# Patient Record
Sex: Female | Born: 1945 | Race: White | Hispanic: No | Marital: Married | Smoking: Never smoker
Health system: Southern US, Community
[De-identification: ages and names within clinical notes are randomized; demographics above are authoritative.]

## PROBLEM LIST (undated history)

## (undated) DIAGNOSIS — K209 Esophagitis, unspecified without bleeding: Secondary | ICD-10-CM

## (undated) DIAGNOSIS — K5730 Diverticulosis of large intestine without perforation or abscess without bleeding: Secondary | ICD-10-CM

## (undated) DIAGNOSIS — R51 Headache: Secondary | ICD-10-CM

## (undated) DIAGNOSIS — N951 Menopausal and female climacteric states: Secondary | ICD-10-CM

## (undated) DIAGNOSIS — E7800 Pure hypercholesterolemia, unspecified: Secondary | ICD-10-CM

## (undated) DIAGNOSIS — T7840XA Allergy, unspecified, initial encounter: Secondary | ICD-10-CM

## (undated) DIAGNOSIS — R519 Headache, unspecified: Secondary | ICD-10-CM

## (undated) DIAGNOSIS — I38 Endocarditis, valve unspecified: Secondary | ICD-10-CM

## (undated) DIAGNOSIS — K2970 Gastritis, unspecified, without bleeding: Secondary | ICD-10-CM

## (undated) DIAGNOSIS — I350 Nonrheumatic aortic (valve) stenosis: Secondary | ICD-10-CM

## (undated) DIAGNOSIS — K2990 Gastroduodenitis, unspecified, without bleeding: Secondary | ICD-10-CM

## (undated) DIAGNOSIS — K219 Gastro-esophageal reflux disease without esophagitis: Secondary | ICD-10-CM

## (undated) DIAGNOSIS — M8580 Other specified disorders of bone density and structure, unspecified site: Secondary | ICD-10-CM

## (undated) DIAGNOSIS — J309 Allergic rhinitis, unspecified: Secondary | ICD-10-CM

## (undated) DIAGNOSIS — K644 Residual hemorrhoidal skin tags: Secondary | ICD-10-CM

## (undated) DIAGNOSIS — I10 Essential (primary) hypertension: Secondary | ICD-10-CM

## (undated) HISTORY — DX: Essential (primary) hypertension: I10

## (undated) HISTORY — PX: TUBAL LIGATION: SHX77

## (undated) HISTORY — DX: Esophagitis, unspecified without bleeding: K20.90

## (undated) HISTORY — DX: Other specified disorders of bone density and structure, unspecified site: M85.80

## (undated) HISTORY — DX: Residual hemorrhoidal skin tags: K64.4

## (undated) HISTORY — DX: Pure hypercholesterolemia, unspecified: E78.00

## (undated) HISTORY — DX: Menopausal and female climacteric states: N95.1

## (undated) HISTORY — DX: Allergy, unspecified, initial encounter: T78.40XA

## (undated) HISTORY — DX: Gastro-esophageal reflux disease without esophagitis: K21.9

## (undated) HISTORY — DX: Esophagitis, unspecified: K20.9

## (undated) HISTORY — DX: Gastroduodenitis, unspecified, without bleeding: K29.90

## (undated) HISTORY — DX: Gastritis, unspecified, without bleeding: K29.70

## (undated) HISTORY — DX: Allergic rhinitis, unspecified: J30.9

## (undated) HISTORY — DX: Diverticulosis of large intestine without perforation or abscess without bleeding: K57.30

---

## 2000-08-04 LAB — HM PAP SMEAR: HM Pap smear: NORMAL

## 2000-09-17 LAB — HM DEXA SCAN

## 2004-07-07 HISTORY — PX: CARDIAC CATHETERIZATION: SHX172

## 2005-06-06 HISTORY — PX: CHOLECYSTECTOMY: SHX55

## 2005-06-12 ENCOUNTER — Other Ambulatory Visit: Payer: Self-pay

## 2005-06-12 ENCOUNTER — Inpatient Hospital Stay: Payer: Self-pay | Admitting: Internal Medicine

## 2005-06-13 ENCOUNTER — Other Ambulatory Visit: Payer: Self-pay

## 2005-06-14 ENCOUNTER — Other Ambulatory Visit: Payer: Self-pay

## 2005-06-15 ENCOUNTER — Other Ambulatory Visit: Payer: Self-pay

## 2005-06-23 ENCOUNTER — Ambulatory Visit: Payer: Self-pay | Admitting: Internal Medicine

## 2005-09-10 ENCOUNTER — Ambulatory Visit: Payer: Self-pay | Admitting: Obstetrics and Gynecology

## 2006-12-29 ENCOUNTER — Ambulatory Visit: Payer: Self-pay | Admitting: Obstetrics and Gynecology

## 2007-01-19 ENCOUNTER — Ambulatory Visit: Payer: Self-pay | Admitting: Gastroenterology

## 2007-01-19 LAB — HM COLONOSCOPY

## 2008-01-10 ENCOUNTER — Ambulatory Visit: Payer: Self-pay | Admitting: Obstetrics and Gynecology

## 2008-02-05 ENCOUNTER — Emergency Department: Payer: Self-pay | Admitting: Emergency Medicine

## 2008-09-18 ENCOUNTER — Emergency Department: Payer: Self-pay | Admitting: Emergency Medicine

## 2009-04-13 ENCOUNTER — Ambulatory Visit: Payer: Self-pay | Admitting: Obstetrics and Gynecology

## 2009-06-13 ENCOUNTER — Ambulatory Visit: Payer: Self-pay | Admitting: Family Medicine

## 2009-07-20 ENCOUNTER — Ambulatory Visit: Payer: Self-pay | Admitting: Family Medicine

## 2009-08-15 ENCOUNTER — Ambulatory Visit: Payer: Self-pay | Admitting: Family Medicine

## 2010-04-17 ENCOUNTER — Ambulatory Visit: Payer: Self-pay | Admitting: Obstetrics and Gynecology

## 2010-06-19 ENCOUNTER — Ambulatory Visit: Payer: Self-pay

## 2010-08-15 ENCOUNTER — Ambulatory Visit: Payer: Self-pay | Admitting: Family Medicine

## 2010-08-28 ENCOUNTER — Encounter: Payer: Self-pay | Admitting: Family Medicine

## 2010-09-05 ENCOUNTER — Encounter: Payer: Self-pay | Admitting: Family Medicine

## 2010-09-25 ENCOUNTER — Ambulatory Visit: Payer: Self-pay

## 2010-10-06 ENCOUNTER — Encounter: Payer: Self-pay | Admitting: Family Medicine

## 2011-04-02 ENCOUNTER — Ambulatory Visit: Payer: Self-pay | Admitting: Family Medicine

## 2011-10-14 ENCOUNTER — Ambulatory Visit: Payer: Self-pay | Admitting: Family Medicine

## 2012-01-15 ENCOUNTER — Ambulatory Visit: Payer: Self-pay | Admitting: Family Medicine

## 2012-04-05 ENCOUNTER — Encounter: Payer: Self-pay | Admitting: *Deleted

## 2012-04-08 ENCOUNTER — Encounter: Payer: Self-pay | Admitting: Family Medicine

## 2012-04-12 ENCOUNTER — Ambulatory Visit (INDEPENDENT_AMBULATORY_CARE_PROVIDER_SITE_OTHER): Payer: BC Managed Care – PPO | Admitting: Family Medicine

## 2012-04-12 ENCOUNTER — Ambulatory Visit: Payer: BC Managed Care – PPO

## 2012-04-12 ENCOUNTER — Encounter: Payer: Self-pay | Admitting: Family Medicine

## 2012-04-12 VITALS — BP 107/77 | HR 73 | Temp 98.4°F | Resp 16 | Ht 62.0 in | Wt 145.0 lb

## 2012-04-12 DIAGNOSIS — R079 Chest pain, unspecified: Secondary | ICD-10-CM

## 2012-04-12 DIAGNOSIS — J309 Allergic rhinitis, unspecified: Secondary | ICD-10-CM | POA: Insufficient documentation

## 2012-04-12 DIAGNOSIS — J209 Acute bronchitis, unspecified: Secondary | ICD-10-CM

## 2012-04-12 DIAGNOSIS — I10 Essential (primary) hypertension: Secondary | ICD-10-CM | POA: Insufficient documentation

## 2012-04-12 DIAGNOSIS — K219 Gastro-esophageal reflux disease without esophagitis: Secondary | ICD-10-CM | POA: Insufficient documentation

## 2012-04-12 DIAGNOSIS — E7800 Pure hypercholesterolemia, unspecified: Secondary | ICD-10-CM | POA: Insufficient documentation

## 2012-04-12 DIAGNOSIS — Z0000 Encounter for general adult medical examination without abnormal findings: Secondary | ICD-10-CM

## 2012-04-12 MED ORDER — CLARITHROMYCIN 500 MG PO TABS: 500.0000 mg | ORAL_TABLET | Freq: Two times a day (BID) | ORAL | Status: DC

## 2012-04-12 NOTE — Progress Notes (Signed)
[address]   [place], [place]  [address]   [phone]  Subjective:    Patient ID: Heather Ortiz, female    DOB: [birth year], 66 y.o.   MRN: [id number]  HPIThis 66 y.o. female presents for evaluation of the following:  1. Hyperlipidemia:  Six month follow-up; no changes to management made at last visit.  Reports good tolerance to medication; good symptom control, good compliance with medication.  +chest pain, no palpitations, shortness of breath, leg swelling. No headaches, dizziness, focal weakness, or paresthesias.  2.  Hypertension:  Six month follow-up.  Home blood pressure running 130/80.  Reports good compliance with medication, good tolerance to medication, good symptom control.  +chest pain yesterday.  No palpitations, shortness of breath, diaphoresis.  3. Low back pain:  Three month follow-up.  Improved from last visit.  Took 7-10 days to improve.    4. Cold: onset one week.  Husband with similar symptoms.  No fever/chills/sweats.  +L ear decreased hearing; no ear pain; +fluttering in ear.  No sore throat.  No headache.  No rhinorrhea.  +nasal congestion mild.  +PND.  +coughing.  +sputum production greenish yellow thick.  No SOB.  No wheezing.  Slightly better.  Nyquil and Claritin.  No vomiting but +diarrhea x 4-5 times yesterday.   5.  Neck strain: cleaning carpets and moving furniture.  Pulled muscles in posterior neck.  Still having pain with rotating side to side.  Will take msucle relaxer.    6.  Allergic Rhinitis:  Horrible all summer.  +rhinorrhea; +sneezing.  Compliance with daily antihistamine; not using nasal steroid regularly.  7. Chest pain: awoke yesterday with substernal/epigastric chest pain; moderate in severity; +pressure; no radiation into jaw or shoulder. No associated diaphoresis, shortness of breath, numbness in L arm, nausea.  Took 2 Tums with improvement; duration 20 minutes; onset at rest.  Exerting self did not worsen or initiate pain.  No recurrence.  Has  suffered with acute illness with decreased appetite, loose stools. Has also been a bit non-compliant with daily Nexium due to family stressors. No indigestion or heartburn with chest pain yesterday.  No recurrence.      Review of Systems  Constitutional: Positive for fatigue. Negative for fever, chills, diaphoresis and unexpected weight change.  HENT: Positive for congestion and postnasal drip. Negative for rhinorrhea and sneezing.   Respiratory: Positive for cough. Negative for shortness of breath, wheezing and stridor.   Cardiovascular: Positive for chest pain. Negative for palpitations and leg swelling.  Gastrointestinal: Negative for nausea, vomiting, abdominal pain, constipation, blood in stool and abdominal distention.  Musculoskeletal: Positive for myalgias and back pain.        Past Medical History  Diagnosis Date  . Pure hypercholesterolemia   . GERD (gastroesophageal reflux disease)   . Allergic rhinitis, cause unspecified   . Unspecified essential hypertension   . Symptomatic menopausal or female climacteric states   . Unspecified gastritis and gastroduodenitis without mention of hemorrhage   . Esophagitis, unspecified   . Diverticulosis of colon (without mention of hemorrhage)   . External hemorrhoids without mention of complication   . Osteopenia     Past Surgical History  Procedure Date  . Tubal ligation   . Cardiac catheterization 2006    Normal coronary arteries. [place]  . Cholecystectomy 06/2005    Prior to Admission medications   Medication Sig Start Date End Date Taking? Authorizing Provider  aspirin 81 MG tablet Take 81 mg by mouth daily.  Yes Historical Provider, MD  Calcium Carbonate-Vitamin D (CALTRATE 600+D) 600-400 MG-UNIT per chew tablet Chew 3 tablets by mouth daily.   Yes Historical Provider, MD  Cholecalciferol (VITAMIN D-3) 1000 UNITS CAPS Take by mouth daily.   Yes Historical Provider, MD  esomeprazole (NEXIUM) 40 MG capsule Take 40 mg by  mouth daily before breakfast.   Yes Historical Provider, MD  hydrochlorothiazide (MICROZIDE) 12.5 MG capsule Take 12.5 mg by mouth daily.   Yes Historical Provider, MD  loratadine (CLARITIN) 10 MG tablet Take 10 mg by mouth daily.   Yes Historical Provider, MD  Multiple Vitamins-Minerals (CENTRUM SILVER PO) Take by mouth daily.   Yes Historical Provider, MD  rosuvastatin (CRESTOR) 10 MG tablet Take 10 mg by mouth daily.   Yes Historical Provider, MD  triamcinolone (NASACORT) 55 MCG/ACT nasal inhaler Place 2 sprays into the nose daily.   Yes Historical Provider, MD    Allergies  Allergen Reactions  . Bee Venom     Bee Stings  . Influenza Vaccines     Arm Swelling  . Levaquin (Levofloxacin In D5w)     Joint pain , jittery  . Lipitor (Atorvastatin)     Joint pain  . Mobic (Meloxicam)     Flush face   . Penicillins Hives  . Simvastatin     Joint pain  . Sulfa Drugs Cross Reactors Hives  . Zithromax (Azithromycin) Hives  . Ceftin (Cefuroxime Axetil) Rash  . Doxycycline Rash    History   Social History  . Marital Status: Married    Spouse Name: N/A    Number of Children: 3  . Years of Education: 12   Occupational History  . retired     Education officer, community [employer]   Social History Main Topics  . Smoking status: Never Smoker   . Smokeless tobacco: Not on file  . Alcohol Use: Yes     once weekly  . Drug Use: No  . Sexually Active: Not on file   Other Topics Concern  . Not on file   Social History Narrative   Always uses seat belts. Married x 44 years happily. 3 children and 8 grandchildren.Caffeine Use: Coffee, tea, 3 servings / day. Exercise: Moderate, 4 x week; Recently joined a gym.    Family History  Problem Relation Age of Onset  . Hypertension Mother   . Heart disease Mother     CAD  . Stroke Mother     TIA's  . Cancer Mother     ? kidney/bladder  . Emphysema Father   . Glaucoma Father     also Mother  . Hyperlipidemia Brother   . Meniere's disease Brother     . Aortic aneurysm      Objective:   Physical Exam  Constitutional: She is oriented to person, place, and time. She appears well-developed and well-nourished. No distress.  HENT:  Head: Normocephalic and atraumatic.  Right Ear: Tympanic membrane, external ear and ear canal normal.  Left Ear: External ear normal. Tympanic membrane is retracted.  Nose: Nose normal.  Mouth/Throat: Oropharynx is clear and moist. No oropharyngeal exudate.  Eyes: Conjunctivae normal and EOM are normal. Pupils are equal, round, and reactive to light.  Neck: Normal range of motion. Neck supple. No thyromegaly present.  Cardiovascular: Normal rate, regular rhythm, normal heart sounds and intact distal pulses.  Exam reveals no gallop and no friction rub.   No murmur heard. Pulmonary/Chest: Effort normal and breath sounds normal. No respiratory distress. She has  no wheezes. She has no rales. She exhibits no tenderness.  Abdominal: Soft. Bowel sounds are normal. She exhibits no distension and no mass. There is no tenderness. There is no rebound and no guarding.  Lymphadenopathy:    She has no cervical adenopathy.  Neurological: She is alert and oriented to person, place, and time. No cranial nerve deficit. She exhibits normal muscle tone. Coordination normal.  Skin: She is not diaphoretic.  Psychiatric: She has a normal mood and affect. Her behavior is normal. Judgment and thought content normal.    EKG: NSR; no acute changes.  [facility] reading (PRIMARY) by  Dr. Katrinka Blazing.  CXR: NAD       Assessment & Plan:   1. Pure hypercholesterolemia  CBC with Differential  2. Routine check-up  CK, Comprehensive metabolic panel, Lipid panel, HgB A1c  3. Chest pain  EKG 12-Lead, DG Chest 2 View  4. Acute bronchitis  clarithromycin (BIAXIN) 500 MG tablet  5. Essential hypertension, benign    6. GERD (gastroesophageal reflux disease)    7. Allergic rhinitis       1.  Hyperlipidemia: controlled; no change in management;  continue current medication. 2.  Hypertension: controlled; no change in management; obtain labs. 3.  Chest pain: New.  Atypical.  Normal EKG; normal CXR.  Consistent with GERD induced chest pain; improved with Tums.  If recurrent and/or exertional, to ED. 4.  Acute bronchitis/URI: new.  Start Mucinex DM bid; if no improvement in 4 days, start Biaxin.   5.  GERD: worsening with non-compliance with daily Nexium.  To restart daily Nexium.   6.  Allergic Rhinitis: worsening over the summer; to increase daily nasal steroid to daily use.

## 2012-04-12 NOTE — Patient Instructions (Addendum)
1. Pure hypercholesterolemia  CBC with Differential  2. Routine check-up  CK, Comprehensive metabolic panel, Lipid panel, HgB A1c  3. Chest pain  EKG 12-Lead, DG Chest 2 View  4. Acute bronchitis  clarithromycin (BIAXIN) 500 MG tablet  5. Essential hypertension, benign    6. GERD (gastroesophageal reflux disease)    7. Allergic rhinitis

## 2012-04-13 LAB — COMPREHENSIVE METABOLIC PANEL
CO2: 30 mEq/L (ref 19–32)
Creat: 0.87 mg/dL (ref 0.50–1.10)
Glucose, Bld: 84 mg/dL (ref 70–99)
Total Bilirubin: 0.6 mg/dL (ref 0.3–1.2)

## 2012-04-13 LAB — CBC WITH DIFFERENTIAL/PLATELET
Basophils Absolute: 0.1 10*3/uL (ref 0.0–0.1)
Basophils Relative: 1 % (ref 0–1)
Eosinophils Absolute: 0.2 10*3/uL (ref 0.0–0.7)
Hemoglobin: 13.9 g/dL (ref 12.0–15.0)
MCH: 30.8 pg (ref 26.0–34.0)
MCHC: 32.8 g/dL (ref 30.0–36.0)
Monocytes Absolute: 0.4 10*3/uL (ref 0.1–1.0)
Monocytes Relative: 6 % (ref 3–12)
Neutrophils Relative %: 56 % (ref 43–77)
RDW: 13.2 % (ref 11.5–15.5)

## 2012-04-13 LAB — LIPID PANEL
Cholesterol: 268 mg/dL — ABNORMAL HIGH (ref 0–200)
HDL: 57 mg/dL (ref >39)
Total CHOL/HDL Ratio: 4.7 Ratio
Triglycerides: 179 mg/dL — ABNORMAL HIGH (ref <150)
VLDL: 36 mg/dL (ref 0–40)

## 2012-04-19 ENCOUNTER — Encounter: Payer: Self-pay | Admitting: Radiology

## 2012-04-24 MED ORDER — ROSUVASTATIN CALCIUM 20 MG PO TABS: 20.0000 mg | ORAL_TABLET | Freq: Every day | ORAL | Status: DC

## 2012-04-24 NOTE — Addendum Note (Signed)
Addended by: Johnnette Litter on: 04/24/2012 11:24 AM   Modules accepted: Orders

## 2012-04-27 NOTE — Progress Notes (Signed)
Three month f-up appt made with Dr. Katrinka Blazing for 07/12/12.Heather Ortiz

## 2012-05-31 ENCOUNTER — Ambulatory Visit: Payer: Self-pay | Admitting: Gastroenterology

## 2012-06-06 HISTORY — PX: COLONOSCOPY: SHX174

## 2012-07-12 ENCOUNTER — Encounter: Payer: Self-pay | Admitting: Family Medicine

## 2012-07-12 ENCOUNTER — Ambulatory Visit (INDEPENDENT_AMBULATORY_CARE_PROVIDER_SITE_OTHER): Payer: BC Managed Care – PPO | Admitting: Family Medicine

## 2012-07-12 VITALS — BP 116/80 | HR 69 | Temp 98.3°F | Resp 16 | Ht 61.75 in | Wt 148.0 lb

## 2012-07-12 DIAGNOSIS — R945 Abnormal results of liver function studies: Secondary | ICD-10-CM

## 2012-07-12 DIAGNOSIS — E7800 Pure hypercholesterolemia, unspecified: Secondary | ICD-10-CM

## 2012-07-12 DIAGNOSIS — K219 Gastro-esophageal reflux disease without esophagitis: Secondary | ICD-10-CM

## 2012-07-12 DIAGNOSIS — R946 Abnormal results of thyroid function studies: Secondary | ICD-10-CM | POA: Insufficient documentation

## 2012-07-12 DIAGNOSIS — I10 Essential (primary) hypertension: Secondary | ICD-10-CM

## 2012-07-12 LAB — CK: Total CK: 27 U/L (ref 7–177)

## 2012-07-12 LAB — COMPREHENSIVE METABOLIC PANEL
ALT: 15 U/L (ref 0–35)
AST: 20 U/L (ref 0–37)
Alkaline Phosphatase: 52 U/L (ref 39–117)
Glucose, Bld: 88 mg/dL (ref 70–99)
Potassium: 4 mEq/L (ref 3.5–5.3)
Sodium: 144 mEq/L (ref 135–145)
Total Bilirubin: 0.5 mg/dL (ref 0.3–1.2)
Total Protein: 7 g/dL (ref 6.0–8.3)

## 2012-07-12 LAB — TSH: TSH: 2.26 u[IU]/mL (ref 0.350–4.500)

## 2012-07-12 LAB — CBC WITH DIFFERENTIAL/PLATELET
Basophils Relative: 1 % (ref 0–1)
Eosinophils Absolute: 0.2 10*3/uL (ref 0.0–0.7)
Eosinophils Relative: 4 % (ref 0–5)
Lymphs Abs: 2 10*3/uL (ref 0.7–4.0)
MCH: 30 pg (ref 26.0–34.0)
MCHC: 33.7 g/dL (ref 30.0–36.0)
MCV: 89 fL (ref 78.0–100.0)
Platelets: 227 10*3/uL (ref 150–400)
RBC: 4.47 MIL/uL (ref 3.87–5.11)

## 2012-07-12 LAB — LIPID PANEL
Total CHOL/HDL Ratio: 3.1 Ratio
VLDL: 33 mg/dL (ref 0–40)

## 2012-07-12 NOTE — Assessment & Plan Note (Signed)
Improved with improved compliance with Nexium.

## 2012-07-12 NOTE — Assessment & Plan Note (Signed)
Uncontrolled; tolerating increase of Crestor to 20mg  qhs; obtain labs.

## 2012-07-12 NOTE — Assessment & Plan Note (Signed)
New at CPE 09/2011; repeat labs today; suffering with cold intolerance.

## 2012-07-12 NOTE — Progress Notes (Signed)
[address]   [place], [place]  [address]   [phone]  Subjective:    Patient ID: Heather Ortiz, female    DOB: 06/12/1946, 67 y.o.   MRN: [id number]  HPIThis 67 y.o. female presents for evaluation of the following:  1.  Hyperlipidemia:  Three month follow-up; changes made at last visit include increasing Crestor to 20mg  qhs.  Reports good compliance with medication; good tolerance to treatment; good symptom control. Denies CP/palp/SOB/leg swelling; denies HA/focal weakness/paresthesias/dizziness.  Due for repeat labs.  2.  Chest pain: resolved after last visit; no recurrence.  S/p EKG stable; s/p CXR negative.  Restarted Nexium.    3.  URI: resolved after last visit ;took Mucinex DM.  Got Biaxin filled but did not need to take it.  4.  GERD:  Improved since restarting Nexium daily.  No n/v/d/c; no melena or bloody stools; no abdominal pain; s/p repeat colonoscopy in 06/2012 by [place]; normal.  5.  Abnormal thyroid function:  Found at CPE in 09/2011; due for repeat labs; +cold intolerance; +cold extremities.      Review of Systems  Constitutional: Negative for fever, chills, diaphoresis and fatigue.  Respiratory: Negative for shortness of breath.   Cardiovascular: Negative for chest pain, palpitations and leg swelling.  Gastrointestinal: Negative for nausea, vomiting, abdominal pain, diarrhea, constipation, blood in stool, abdominal distention, anal bleeding and rectal pain.  Neurological: Negative for dizziness, tremors, seizures, syncope, facial asymmetry, speech difficulty, weakness, light-headedness, numbness and headaches.        Past Medical History  Diagnosis Date  . Pure hypercholesterolemia   . GERD (gastroesophageal reflux disease)   . Allergic rhinitis, cause unspecified   . Unspecified essential hypertension   . Symptomatic menopausal or female climacteric states   . Unspecified gastritis and gastroduodenitis without mention of hemorrhage   . Esophagitis,  unspecified   . Diverticulosis of colon (without mention of hemorrhage)   . External hemorrhoids without mention of complication   . Osteopenia     Past Surgical History  Procedure Date  . Tubal ligation   . Cardiac catheterization 2006    Normal coronary arteries. [place]  . Cholecystectomy 06/2005    Prior to Admission medications   Medication Sig Start Date End Date Taking? Authorizing Provider  aspirin 81 MG tablet Take 81 mg by mouth daily.   Yes Historical Provider, MD  Calcium Carbonate-Vitamin D (CALTRATE 600+D) 600-400 MG-UNIT per chew tablet Chew 3 tablets by mouth daily.   Yes Historical Provider, MD  Cholecalciferol (VITAMIN D-3) 1000 UNITS CAPS Take by mouth daily.   Yes Historical Provider, MD  esomeprazole (NEXIUM) 40 MG capsule Take 40 mg by mouth daily before breakfast.   Yes Historical Provider, MD  hydrochlorothiazide (MICROZIDE) 12.5 MG capsule Take 12.5 mg by mouth daily.   Yes Historical Provider, MD  loratadine (CLARITIN) 10 MG tablet Take 10 mg by mouth daily.   Yes Historical Provider, MD  Multiple Vitamins-Minerals (CENTRUM SILVER PO) Take by mouth daily.   Yes Historical Provider, MD  OVER THE COUNTER MEDICATION OTC Align taking once daily   Yes Historical Provider, MD  PRESCRIPTION MEDICATION Mirvaso 0.33% cream using once daily   Yes Historical Provider, MD  rosuvastatin (CRESTOR) 20 MG tablet Take 1 tablet (20 mg total) by mouth at bedtime. 04/24/12  Yes Ethelda Chick, MD  triamcinolone (NASACORT) 55 MCG/ACT nasal inhaler Place 2 sprays into the nose daily.   Yes Historical Provider, MD  clarithromycin (BIAXIN) 500 MG tablet  Take 1 tablet (500 mg total) by mouth 2 (two) times daily. PLEASE PLACE ON HOLD. 04/12/12   Ethelda Chick, MD    Allergies  Allergen Reactions  . Bee Venom     Bee Stings  . Influenza Vaccines     Arm Swelling  . Levaquin (Levofloxacin In D5w)     Joint pain , jittery  . Lipitor (Atorvastatin)     Joint pain  . Mobic  (Meloxicam)     Flush face   . Penicillins Hives  . Simvastatin     Joint pain  . Sulfa Drugs Cross Reactors Hives  . Zithromax (Azithromycin) Hives  . Ceftin (Cefuroxime Axetil) Rash  . Doxycycline Rash    History   Social History  . Marital Status: Married    Spouse Name: N/A    Number of Children: 3  . Years of Education: 12   Occupational History  . retired     Education officer, community [employer]   Social History Main Topics  . Smoking status: Never Smoker   . Smokeless tobacco: Not on file  . Alcohol Use: Yes     Comment: once weekly  . Drug Use: No  . Sexually Active: Not on file   Other Topics Concern  . Not on file   Social History Narrative   Always uses seat belts. Married x 44 years happily. 3 children and 8 grandchildren.Caffeine Use: Coffee, tea, 3 servings / day. Exercise: Moderate, 4 x week; Recently joined a gym.    Family History  Problem Relation Age of Onset  . Hypertension Mother   . Heart disease Mother     CAD  . Stroke Mother     TIA's  . Cancer Mother     ? kidney/bladder  . Emphysema Father   . Glaucoma Father     also Mother  . Hyperlipidemia Brother   . Meniere's disease Brother   . Aortic aneurysm      Objective:   Physical Exam  Nursing note and vitals reviewed. Constitutional: She is oriented to person, place, and time. She appears well-developed and well-nourished. No distress.  HENT:  Head: Normocephalic and atraumatic.  Right Ear: External ear normal.  Left Ear: External ear normal.  Nose: Nose normal.  Mouth/Throat: Oropharynx is clear and moist.  Eyes: Conjunctivae normal and EOM are normal. Pupils are equal, round, and reactive to light.  Neck: Normal range of motion. Neck supple. No thyromegaly present.  Cardiovascular: Normal rate, regular rhythm and intact distal pulses.  Exam reveals no gallop and no friction rub.   No murmur heard. Pulmonary/Chest: Effort normal and breath sounds normal.  Abdominal: Soft. Bowel sounds are  normal. She exhibits no distension and no mass. There is no tenderness. There is no rebound and no guarding.  Lymphadenopathy:    She has no cervical adenopathy.  Neurological: She is alert and oriented to person, place, and time.  Skin: She is not diaphoretic.  Psychiatric: She has a normal mood and affect. Her behavior is normal. Judgment and thought content normal.       Assessment & Plan:   1. Pure hypercholesterolemia  CK, Comprehensive metabolic panel, CBC with Differential, Lipid panel  2. Thyroid function study abnormality  TSH  3. GERD (gastroesophageal reflux disease)

## 2012-07-12 NOTE — Assessment & Plan Note (Signed)
Controlled; no change in medication; obtain labs.

## 2012-07-12 NOTE — Patient Instructions (Addendum)
1. Pure hypercholesterolemia  CK, Comprehensive metabolic panel, CBC with Differential, Lipid panel  2. Thyroid function study abnormality    3. GERD (gastroesophageal reflux disease)

## 2012-08-21 ENCOUNTER — Other Ambulatory Visit: Payer: Self-pay

## 2012-10-11 ENCOUNTER — Encounter: Payer: BC Managed Care – PPO | Admitting: Family Medicine

## 2012-12-20 ENCOUNTER — Encounter: Payer: Self-pay | Admitting: Family Medicine

## 2012-12-20 ENCOUNTER — Ambulatory Visit (INDEPENDENT_AMBULATORY_CARE_PROVIDER_SITE_OTHER): Payer: BC Managed Care – PPO | Admitting: Family Medicine

## 2012-12-20 VITALS — BP 108/73 | HR 64 | Temp 98.5°F | Resp 16 | Ht 62.0 in | Wt 153.0 lb

## 2012-12-20 DIAGNOSIS — E7800 Pure hypercholesterolemia, unspecified: Secondary | ICD-10-CM

## 2012-12-20 DIAGNOSIS — N898 Other specified noninflammatory disorders of vagina: Secondary | ICD-10-CM

## 2012-12-20 DIAGNOSIS — K219 Gastro-esophageal reflux disease without esophagitis: Secondary | ICD-10-CM

## 2012-12-20 DIAGNOSIS — Z23 Encounter for immunization: Secondary | ICD-10-CM

## 2012-12-20 DIAGNOSIS — Z0000 Encounter for general adult medical examination without abnormal findings: Secondary | ICD-10-CM

## 2012-12-20 DIAGNOSIS — Z01419 Encounter for gynecological examination (general) (routine) without abnormal findings: Secondary | ICD-10-CM | POA: Insufficient documentation

## 2012-12-20 DIAGNOSIS — I10 Essential (primary) hypertension: Secondary | ICD-10-CM

## 2012-12-20 LAB — COMPREHENSIVE METABOLIC PANEL
Albumin: 4.4 g/dL (ref 3.5–5.2)
BUN: 17 mg/dL (ref 6–23)
CO2: 27 mEq/L (ref 19–32)
Calcium: 9.7 mg/dL (ref 8.4–10.5)
Chloride: 104 mEq/L (ref 96–112)
Creat: 0.78 mg/dL (ref 0.50–1.10)
Glucose, Bld: 91 mg/dL (ref 70–99)

## 2012-12-20 LAB — POCT URINALYSIS DIPSTICK
Glucose, UA: NEGATIVE
Nitrite, UA: NEGATIVE
Protein, UA: NEGATIVE
Urobilinogen, UA: 0.2

## 2012-12-20 LAB — CK: Total CK: 36 U/L (ref 7–177)

## 2012-12-20 LAB — CBC WITH DIFFERENTIAL/PLATELET
Basophils Absolute: 0.1 10*3/uL (ref 0.0–0.1)
HCT: 38.5 % (ref 36.0–46.0)
Lymphocytes Relative: 41 % (ref 12–46)
Neutro Abs: 2.6 10*3/uL (ref 1.7–7.7)
Platelets: 244 10*3/uL (ref 150–400)
RBC: 4.43 MIL/uL (ref 3.87–5.11)
RDW: 13.5 % (ref 11.5–15.5)
WBC: 5.4 10*3/uL (ref 4.0–10.5)

## 2012-12-20 LAB — POCT UA - MICROSCOPIC ONLY
Crystals, Ur, HPF, POC: NEGATIVE
Yeast, UA: NEGATIVE

## 2012-12-20 LAB — FOLATE: Folate: >20 ng/mL

## 2012-12-20 LAB — HEMOGLOBIN A1C: Hgb A1c MFr Bld: 5.4 % (ref <5.7)

## 2012-12-20 LAB — LIPID PANEL
Cholesterol: 192 mg/dL (ref 0–200)
Triglycerides: 148 mg/dL (ref <150)

## 2012-12-20 LAB — VITAMIN B12: Vitamin B-12: 844 pg/mL (ref 211–911)

## 2012-12-20 MED ORDER — HYDROCHLOROTHIAZIDE 12.5 MG PO CAPS: 12.5000 mg | ORAL_CAPSULE | Freq: Every day | ORAL | Status: DC

## 2012-12-20 MED ORDER — ROSUVASTATIN CALCIUM 20 MG PO TABS: 20.0000 mg | ORAL_TABLET | Freq: Every day | ORAL | Status: DC

## 2012-12-20 MED ORDER — ESOMEPRAZOLE MAGNESIUM 40 MG PO CPDR: 40.0000 mg | DELAYED_RELEASE_CAPSULE | Freq: Every day | ORAL | Status: DC

## 2012-12-20 MED ORDER — TERCONAZOLE 0.4 % VA CREA: 1.0000 | TOPICAL_CREAM | Freq: Every day | VAGINAL | Status: DC

## 2012-12-20 NOTE — Progress Notes (Signed)
[address]   [place], [place]  [address]   [phone]  Subjective:    Patient ID: Heather Ortiz, female    DOB: Jan 11, 1946, 67 y.o.   MRN: [id number]  HPI This 67 y.o. female presents for CPE.  Last physical 10/14/2011. Pap smear 10/2011.  Mammogram 11/04/11. Bone density scan 11/04/2011. Colonoscopy 01/19/2007; just completed one 06/2012.  [place].  Negative.  Repeat five years. Tetanus 07/07/2010.  Pneumovax never.   Zostavax never. Flu vaccines allergic. Eye exam.  07/2012; +glasses; [place]. +cataract mild.   Dental exam 4 months ago.  Vaginal itching: used Lotrimin for five days with improvement.  Now itching has recurred.  Thinks sweating during the summer months is contributing.   Review of Systems  Constitutional: Negative for fever, chills, diaphoresis, activity change, appetite change, fatigue and unexpected weight change.  HENT: Negative for hearing loss, ear pain, nosebleeds, congestion, sore throat, facial swelling, rhinorrhea, sneezing, drooling, mouth sores, trouble swallowing, neck pain, neck stiffness, dental problem, voice change, postnasal drip, sinus pressure, tinnitus and ear discharge.   Eyes: Negative for photophobia, pain, discharge, redness, itching and visual disturbance.  Respiratory: Negative for apnea, cough, choking, chest tightness, shortness of breath, wheezing and stridor.   Cardiovascular: Negative for chest pain, palpitations and leg swelling.  Gastrointestinal: Negative for nausea, vomiting, abdominal pain, diarrhea, constipation, blood in stool, abdominal distention, anal bleeding and rectal pain.  Endocrine: Negative for cold intolerance, heat intolerance, polydipsia, polyphagia and polyuria.  Genitourinary: Negative for dysuria, urgency, frequency, hematuria, flank pain, decreased urine volume, vaginal bleeding, vaginal discharge, enuresis, difficulty urinating, genital sores, pelvic pain and dyspareunia.  Musculoskeletal: Negative for  myalgias, back pain, joint swelling, arthralgias and gait problem.  Skin: Negative for color change, pallor, rash and wound.  Allergic/Immunologic: Negative for environmental allergies, food allergies and immunocompromised state.  Neurological: Negative for dizziness, tremors, seizures, syncope, facial asymmetry, speech difficulty, weakness, light-headedness, numbness and headaches.  Hematological: Negative for adenopathy. Does not bruise/bleed easily.  Psychiatric/Behavioral: Negative for suicidal ideas, hallucinations, behavioral problems, confusion, sleep disturbance, self-injury, dysphoric mood, decreased concentration and agitation. The patient is not nervous/anxious and is not hyperactive.     Past Medical History  Diagnosis Date  . Pure hypercholesterolemia   . GERD (gastroesophageal reflux disease)   . Allergic rhinitis, cause unspecified   . Unspecified essential hypertension   . Symptomatic menopausal or female climacteric states   . Unspecified gastritis and gastroduodenitis without mention of hemorrhage   . Esophagitis, unspecified   . Diverticulosis of colon (without mention of hemorrhage)   . External hemorrhoids without mention of complication   . Osteopenia     Past Surgical History  Procedure Laterality Date  . Tubal ligation    . Cardiac catheterization  2006    Normal coronary arteries. [place]  . Cholecystectomy  06/2005  . Colonoscopy  06/06/2012    normal. [place].  Repeat 5 years.    Prior to Admission medications   Medication Sig Start Date End Date Taking? Authorizing Provider  aspirin 81 MG tablet Take 81 mg by mouth daily.   Yes Historical Provider, MD  Cholecalciferol (VITAMIN D-3) 1000 UNITS CAPS Take by mouth daily.   Yes Historical Provider, MD  co-enzyme Q-10 30 MG capsule Take 200 mg by mouth 3 (three) times daily.   Yes Historical Provider, MD  esomeprazole (NEXIUM) 40 MG capsule Take 1 capsule (40 mg total) by mouth daily before breakfast.  12/20/12  Yes Ethelda Chick, MD  hydrochlorothiazide (MICROZIDE)  12.5 MG capsule Take 1 capsule (12.5 mg total) by mouth daily. 12/20/12  Yes Ethelda Chick, MD  loratadine (CLARITIN) 10 MG tablet Take 10 mg by mouth daily.   Yes Historical Provider, MD  Multiple Vitamins-Minerals (CENTRUM SILVER PO) Take by mouth daily.   Yes Historical Provider, MD  OVER THE COUNTER MEDICATION OTC Align taking once daily   Yes Historical Provider, MD  PRESCRIPTION MEDICATION Mirvaso 0.33% cream using once daily   Yes Historical Provider, MD  rosuvastatin (CRESTOR) 20 MG tablet Take 1 tablet (20 mg total) by mouth at bedtime. 12/20/12  Yes Ethelda Chick, MD  triamcinolone (NASACORT) 55 MCG/ACT nasal inhaler Place 2 sprays into the nose daily.   Yes Historical Provider, MD  terconazole (TERAZOL 7) 0.4 % vaginal cream Place 1 applicator vaginally at bedtime. 12/20/12   Ethelda Chick, MD    Allergies  Allergen Reactions  . Bee Venom     Bee Stings  . Influenza Vaccines     Arm Swelling  . Levaquin (Levofloxacin In D5w)     Joint pain , jittery  . Lipitor (Atorvastatin)     Joint pain  . Mobic (Meloxicam)     Flush face   . Penicillins Hives  . Simvastatin     Joint pain  . Sulfa Drugs Cross Reactors Hives  . Zithromax (Azithromycin) Hives  . Ceftin (Cefuroxime Axetil) Rash  . Doxycycline Rash    History   Social History  . Marital Status: Married    Spouse Name: N/A    Number of Children: 3  . Years of Education: 12   Occupational History  . retired     Education officer, community [employer]   Social History Main Topics  . Smoking status: Never Smoker   . Smokeless tobacco: Not on file  . Alcohol Use: Yes     Comment: once weekly  . Drug Use: No  . Sexually Active: Yes   Other Topics Concern  . Not on file   Social History Narrative   Always uses seat belts.    Married x 45 years; happily; no abuse.       Children:  3 children and 8 grandchildren.      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        Tobacco; never      Alcohol: rare      Drugs: none     Caffeine Use: Coffee, tea, 3 servings / day.       Exercise: Moderate, 4 x week; Recently joined a gym.    Family History  Problem Relation Age of Onset  . Hypertension Mother   . Heart disease Mother     CAD/valve replacement/CABG  . Stroke Mother 68    TIA's multiple  . Cancer Mother     bladder  . Glaucoma Mother   . Aortic aneurysm Mother   . Emphysema Father   . Glaucoma Father     also Mother  . COPD Father   . Hyperlipidemia Brother   . Meniere's disease Brother   . Aortic aneurysm         Objective:   Physical Exam  Vitals reviewed. Constitutional: She is oriented to person, place, and time. She appears well-developed and well-nourished. No distress.  HENT:  Head: Normocephalic and atraumatic.  Right Ear: External ear normal.  Left Ear: External ear normal.  Nose: Nose normal.  Mouth/Throat: Oropharynx is clear and moist.  Eyes: Conjunctivae and EOM are normal.  Pupils are equal, round, and reactive to light.  Neck: Normal range of motion. Neck supple. No thyromegaly present.  Cardiovascular: Normal rate, regular rhythm, normal heart sounds and intact distal pulses.  Exam reveals no gallop and no friction rub.   No murmur heard. Pulmonary/Chest: Effort normal and breath sounds normal.  Abdominal: Soft. Bowel sounds are normal. She exhibits no distension and no mass. There is no tenderness. There is no rebound and no guarding. Hernia confirmed negative in the right inguinal area and confirmed negative in the left inguinal area.  Genitourinary: Vagina normal and uterus normal. No breast swelling, tenderness, discharge or bleeding. There is no rash, tenderness, lesion or injury on the right labia. There is rash on the left labia. There is no tenderness, lesion or injury on the left labia. Uterus is not deviated, not enlarged, not fixed and not tender. Cervix exhibits no motion tenderness, no  discharge and no friability. Right adnexum displays no mass, no tenderness and no fullness. Left adnexum displays no mass, no tenderness and no fullness.  L labia majora with erythema.  Musculoskeletal: Normal range of motion.  Lymphadenopathy:    She has no cervical adenopathy.       Right: No inguinal adenopathy present.       Left: No inguinal adenopathy present.  Neurological: She is alert and oriented to person, place, and time. She has normal reflexes.  Skin: Skin is warm and dry. She is not diaphoretic.  Psychiatric: She has a normal mood and affect. Her behavior is normal. Judgment and thought content normal.   PNEUMOVAX ADMINISTERED IN OFFICE.  Results for orders placed in visit on 12/20/12  POCT URINALYSIS DIPSTICK      Result Value Range   Color, UA yellow     Clarity, UA clear     Glucose, UA neg     Bilirubin, UA neg     Ketones, UA neg     Spec Grav, UA 1.020     Blood, UA trace     pH, UA 6.0     Protein, UA neg     Urobilinogen, UA 0.2     Nitrite, UA neg     Leukocytes, UA Negative    POCT UA - MICROSCOPIC ONLY      Result Value Range   WBC, Ur, HPF, POC 0-3     RBC, urine, microscopic 0-10     Bacteria, U Microscopic neg     Mucus, UA large     Epithelial cells, urine per micros 0-4     Crystals, Ur, HPF, POC neg     Casts, Ur, LPF, POC neg     Yeast, UA neg         Assessment & Plan:  Routine general medical examination at a health care facility - Plan: CBC with Differential, CK, Comprehensive metabolic panel, Hemoglobin A1c, Lipid panel, TSH, Vitamin B12, Folate, Vitamin D 25 hydroxy, POCT urinalysis dipstick, POCT UA - Microscopic Only, Pneumococcal polysaccharide vaccine 23-valent greater than or equal to 2yo subcutaneous/IM  Routine gynecological examination - Plan: MM Digital Screening  Vaginal itching - Plan: terconazole (TERAZOL 7) 0.4 % vaginal cream  Essential hypertension, benign - Plan: hydrochlorothiazide (MICROZIDE) 12.5 MG  capsule  Pure hypercholesterolemia - Plan: rosuvastatin (CRESTOR) 20 MG tablet  GERD (gastroesophageal reflux disease) - Plan: esomeprazole (NEXIUM) 40 MG capsule   Meds ordered this encounter  Medications  . co-enzyme Q-10 30 MG capsule    Sig: Take 200 mg by mouth  3 (three) times daily.  Marland Kitchen DISCONTD: rosuvastatin (CRESTOR) 20 MG tablet    Sig: Take 10 mg by mouth at bedtime.  Marland Kitchen terconazole (TERAZOL 7) 0.4 % vaginal cream    Sig: Place 1 applicator vaginally at bedtime.    Dispense:  45 g    Refill:  0  . rosuvastatin (CRESTOR) 20 MG tablet    Sig: Take 1 tablet (20 mg total) by mouth at bedtime.    Dispense:  90 tablet    Refill:  3  . hydrochlorothiazide (MICROZIDE) 12.5 MG capsule    Sig: Take 1 capsule (12.5 mg total) by mouth daily.    Dispense:  90 capsule    Refill:  3  . esomeprazole (NEXIUM) 40 MG capsule    Sig: Take 1 capsule (40 mg total) by mouth daily before breakfast.    Dispense:  90 capsule    Refill:  3

## 2012-12-20 NOTE — Progress Notes (Signed)
  Subjective:    Patient ID: Heather Ortiz, female    DOB: 1946/07/02, 67 y.o.   MRN: [id number]  HPI    Review of Systems  Respiratory: Positive for choking.   Musculoskeletal: Positive for back pain.       Objective:   Physical Exam        Assessment & Plan:

## 2012-12-20 NOTE — Assessment & Plan Note (Signed)
Completed; pap smear UTD 2013.  Refer for mammogram.

## 2012-12-20 NOTE — Assessment & Plan Note (Signed)
Anticipatory guidance--- weight loss, exercise.  Pap smear UTD 10/2011.  Mammogram 10/2012.  Refer for mammogram.  Colonoscopy UTD.  Bone density scan 2013.  S/p Pneumovax in office; rx for Zostavax provided. Obtain labs.

## 2012-12-20 NOTE — Assessment & Plan Note (Signed)
New.  L labia irritation; treat with Terazol; if persists, refer to gynecology for biopsy.

## 2012-12-21 ENCOUNTER — Encounter: Payer: Self-pay | Admitting: Family Medicine

## 2012-12-21 LAB — VITAMIN D 25 HYDROXY (VIT D DEFICIENCY, FRACTURES): Vit D, 25-Hydroxy: 58 ng/mL (ref 30–89)

## 2013-01-12 ENCOUNTER — Ambulatory Visit: Payer: Self-pay | Admitting: Family Medicine

## 2013-01-14 ENCOUNTER — Telehealth: Payer: Self-pay

## 2013-01-14 NOTE — Telephone Encounter (Signed)
Pt called in regards to getting a bill from [facility] because tests would not be covered under V70.0. Called [facility] and spoke to [place] and gave them 272.0 and 401.9 as additional codes.

## 2013-01-18 ENCOUNTER — Telehealth: Payer: Self-pay | Admitting: *Deleted

## 2013-01-18 NOTE — Telephone Encounter (Signed)
Left message for rtn call-  Received mammogram results- normal. Results sent for scanning.

## 2013-02-15 ENCOUNTER — Encounter: Payer: Self-pay | Admitting: Family Medicine

## 2013-03-31 ENCOUNTER — Telehealth: Payer: Self-pay

## 2013-03-31 NOTE — Telephone Encounter (Signed)
PT IS TAKING CRESTOR AS PRESCRIBED BY DR Katrinka Blazing , BUT IS NOW EXPERIENCING JOINT PAIN IN KNEES AND HIPS AND CRAMPS IN BOTH LEGS, MORE SO ON THE RIGHT SIDE. PT WANTS TO KNOW WHAT SHE SHOULD DO, SHE IS CONCERNED THAT THE SYMPTOMS ARE SIDE EFFECTS OF THE MEDICATION. PLEASE CALL PT TO ADVISE

## 2013-03-31 NOTE — Telephone Encounter (Signed)
She should d/c the medication and drink lots of fluids if the symptoms do not resolve quickly within a couple days, she should return to clinic. Left message for her to call me back so I can advise. She called back and was advised. She has appt in December, and will discuss with Dr Katrinka Blazing. She indicates she will be willing to try a lower dose of medication in December, if Dr Katrinka Blazing wants her to try. To you FYI Heather Ortiz

## 2013-04-01 NOTE — Telephone Encounter (Signed)
Agree with below recommendations.

## 2013-05-12 ENCOUNTER — Other Ambulatory Visit: Payer: Self-pay

## 2013-06-20 ENCOUNTER — Encounter: Payer: Self-pay | Admitting: Family Medicine

## 2013-06-20 ENCOUNTER — Ambulatory Visit (INDEPENDENT_AMBULATORY_CARE_PROVIDER_SITE_OTHER): Payer: BC Managed Care – PPO | Admitting: Family Medicine

## 2013-06-20 VITALS — BP 122/72 | HR 63 | Temp 98.2°F | Resp 16 | Ht 61.5 in | Wt 149.2 lb

## 2013-06-20 DIAGNOSIS — K219 Gastro-esophageal reflux disease without esophagitis: Secondary | ICD-10-CM

## 2013-06-20 DIAGNOSIS — J309 Allergic rhinitis, unspecified: Secondary | ICD-10-CM

## 2013-06-20 DIAGNOSIS — F4321 Adjustment disorder with depressed mood: Secondary | ICD-10-CM | POA: Insufficient documentation

## 2013-06-20 DIAGNOSIS — E7800 Pure hypercholesterolemia, unspecified: Secondary | ICD-10-CM

## 2013-06-20 DIAGNOSIS — F4320 Adjustment disorder, unspecified: Secondary | ICD-10-CM

## 2013-06-20 DIAGNOSIS — L293 Anogenital pruritus, unspecified: Secondary | ICD-10-CM

## 2013-06-20 DIAGNOSIS — I10 Essential (primary) hypertension: Secondary | ICD-10-CM

## 2013-06-20 DIAGNOSIS — N898 Other specified noninflammatory disorders of vagina: Secondary | ICD-10-CM

## 2013-06-20 LAB — LIPID PANEL
HDL: 63 mg/dL (ref >39)
LDL Cholesterol: 164 mg/dL — ABNORMAL HIGH (ref 0–99)
Total CHOL/HDL Ratio: 4 Ratio
Triglycerides: 133 mg/dL (ref <150)
VLDL: 27 mg/dL (ref 0–40)

## 2013-06-20 LAB — CBC
MCV: 86.2 fL (ref 78.0–100.0)
Platelets: 234 10*3/uL (ref 150–400)
RDW: 13.9 % (ref 11.5–15.5)
WBC: 4.5 10*3/uL (ref 4.0–10.5)

## 2013-06-20 LAB — COMPLETE METABOLIC PANEL WITH GFR
ALT: 12 U/L (ref 0–35)
AST: 17 U/L (ref 0–37)
Chloride: 104 mEq/L (ref 96–112)
Creat: 0.91 mg/dL (ref 0.50–1.10)
Total Bilirubin: 0.6 mg/dL (ref 0.3–1.2)

## 2013-06-20 NOTE — Progress Notes (Signed)
Subjective:    Patient ID: Heather Ortiz, female    DOB: Feb 11, 1946, 67 y.o.   MRN: [id number]  HPI This 67 y.o. female presents for six month follow-up of the following:  1. HTN: six month follow-up; no changes to management made at last visit.  HCTZ 12.5mg  daily.  BP running 110/67-134/85.  No CP/SOB; +leg swelling at end of day.    2.  Hyperlipidemia:  Six month follow-up; no changes to management made at last visit; patient called 03/31/13 complaining of knee pain and joint pains and concerned symptoms due to Crestor; advised to stop Crestor and drink fluids.  Willing to try lower dose of medication?  Hips, knees B; had fallen in August; went to [facility] in clinic; hurt wrist L; wrist brace.  Stated that should have pain for one week but had pain for weeks 3+.  Has been off of Crestor 20mg  daily for three months; walking 3 days per week; walking 10,000 steps per session.  Also following weight watchers;for past year; no weight loss.  WW will not give life time membership until reaches goal weight of 132-137.  Has decreased to 149 with walking three days per week.  Has lost 20 pounds with [employer] in past three years.  Walks neighborhood which is 3 miles; takes one hour.  Had been taking Crestor 10mg  alternating with 20mg  qod.  Decreased Crestor to 10mg  daily for one week with persistent aching.  Still having aching.  Some days wakes up with hip and knees; occurs once every two weeks.    3.  Vaginal itching/labial itching: prescribed Terazol at CPE six months ago.  If persistent, advised to undergo gynecological evaluation.  Symptoms improved; spotted with Terazol cream if used internally.  Still has intermittent vaginal itching; can occur once every month to every three months.    4.  Health Maintenance:  Must have shingles vaccine at doctor's office.  Not sure if should be nurse visit or regular visit.     Review of Systems  Constitutional: Negative for fever, chills, diaphoresis and fatigue.    Eyes: Negative for visual disturbance.  Respiratory: Negative for cough, shortness of breath, wheezing and stridor.   Cardiovascular: Positive for leg swelling. Negative for chest pain and palpitations.  Gastrointestinal: Negative for nausea, vomiting, abdominal pain and diarrhea.  Endocrine: Negative for cold intolerance, heat intolerance, polydipsia, polyphagia and polyuria.  Genitourinary: Negative for vaginal discharge and vaginal pain.  Musculoskeletal: Positive for arthralgias. Negative for joint swelling.  Neurological: Negative for dizziness, tremors, seizures, syncope, facial asymmetry, speech difficulty, weakness, light-headedness, numbness and headaches.       Objective:   Physical Exam  Constitutional: She is oriented to person, place, and time. She appears well-developed and well-nourished. No distress.  HENT:  Head: Normocephalic and atraumatic.  Right Ear: External ear normal.  Left Ear: External ear normal.  Nose: Nose normal.  Mouth/Throat: Oropharynx is clear and moist.  Eyes: Conjunctivae and EOM are normal. Pupils are equal, round, and reactive to light.  Neck: Normal range of motion. Neck supple. Carotid bruit is not present. No thyromegaly present.  Cardiovascular: Normal rate, regular rhythm, normal heart sounds and intact distal pulses.  Exam reveals no gallop and no friction rub.   No murmur heard. Pulmonary/Chest: Effort normal and breath sounds normal. She has no wheezes. She has no rales.  Abdominal: Soft. Bowel sounds are normal. She exhibits no distension and no mass. There is no tenderness. There is no rebound and  no guarding.  Lymphadenopathy:    She has no cervical adenopathy.  Neurological: She is alert and oriented to person, place, and time. No cranial nerve deficit.  Skin: Skin is warm and dry. No rash noted. She is not diaphoretic. No erythema. No pallor.  Psychiatric: She has a normal mood and affect. Her behavior is normal.       Assessment  & Plan:  Pure hypercholesterolemia - Plan: Lipid panel  Essential hypertension, benign - Plan: CBC, COMPLETE METABOLIC PANEL WITH GFR  Allergic rhinitis  GERD (gastroesophageal reflux disease)  Vaginal itching  Grief reaction  1. Hypercholesterolemia: controlled but suffering with arthralgias with Crestor.   2.  HTN : controlled; obtain labs; continue current medications. 3.  Allergic Rhinitis: controlled; continue current medications. 4. GERD: controlled; continue current medications. 5.  Vaginal itching: persistent but intermittent; if becomes daily, refer to gynecology. 6. Grief reaction:  New. Counseling provided during visit.  No orders of the defined types were placed in this encounter.   Nilda Simmer, M.D.  [facility] [address] [place], [place]  [address] [phone] phone [phone] fax

## 2013-07-03 ENCOUNTER — Encounter: Payer: Self-pay | Admitting: Family Medicine

## 2013-11-22 ENCOUNTER — Other Ambulatory Visit: Payer: Self-pay | Admitting: Family Medicine

## 2013-12-19 ENCOUNTER — Encounter: Payer: BC Managed Care – PPO | Admitting: Family Medicine

## 2014-01-04 ENCOUNTER — Ambulatory Visit (INDEPENDENT_AMBULATORY_CARE_PROVIDER_SITE_OTHER): Payer: BC Managed Care – PPO | Admitting: Family Medicine

## 2014-01-04 ENCOUNTER — Encounter: Payer: Self-pay | Admitting: Family Medicine

## 2014-01-04 VITALS — BP 130/80 | HR 62 | Temp 98.6°F | Resp 16 | Ht 61.5 in | Wt 157.2 lb

## 2014-01-04 DIAGNOSIS — J3089 Other allergic rhinitis: Secondary | ICD-10-CM

## 2014-01-04 DIAGNOSIS — K219 Gastro-esophageal reflux disease without esophagitis: Secondary | ICD-10-CM

## 2014-01-04 DIAGNOSIS — Z01419 Encounter for gynecological examination (general) (routine) without abnormal findings: Secondary | ICD-10-CM

## 2014-01-04 DIAGNOSIS — E7800 Pure hypercholesterolemia, unspecified: Secondary | ICD-10-CM

## 2014-01-04 DIAGNOSIS — N952 Postmenopausal atrophic vaginitis: Secondary | ICD-10-CM

## 2014-01-04 DIAGNOSIS — J302 Other seasonal allergic rhinitis: Secondary | ICD-10-CM

## 2014-01-04 DIAGNOSIS — Z0000 Encounter for general adult medical examination without abnormal findings: Secondary | ICD-10-CM

## 2014-01-04 DIAGNOSIS — I10 Essential (primary) hypertension: Secondary | ICD-10-CM

## 2014-01-04 DIAGNOSIS — Z131 Encounter for screening for diabetes mellitus: Secondary | ICD-10-CM

## 2014-01-04 DIAGNOSIS — Z23 Encounter for immunization: Secondary | ICD-10-CM

## 2014-01-04 LAB — CBC WITH DIFFERENTIAL/PLATELET
Basophils Absolute: 0 K/uL (ref 0.0–0.1)
Basophils Relative: 1 % (ref 0–1)
Eosinophils Absolute: 0.1 K/uL (ref 0.0–0.7)
Eosinophils Relative: 3 % (ref 0–5)
HCT: 36.2 % (ref 36.0–46.0)
Hemoglobin: 12.1 g/dL (ref 12.0–15.0)
Lymphocytes Relative: 41 % (ref 12–46)
Lymphs Abs: 2 K/uL (ref 0.7–4.0)
MCH: 29.2 pg (ref 26.0–34.0)
MCHC: 33.4 g/dL (ref 30.0–36.0)
MCV: 87.2 fL (ref 78.0–100.0)
Monocytes Absolute: 0.4 K/uL (ref 0.1–1.0)
Monocytes Relative: 8 % (ref 3–12)
Neutro Abs: 2.3 K/uL (ref 1.7–7.7)
Neutrophils Relative %: 47 % (ref 43–77)
Platelets: 232 K/uL (ref 150–400)
RBC: 4.15 MIL/uL (ref 3.87–5.11)
RDW: 13.6 % (ref 11.5–15.5)
WBC: 4.8 K/uL (ref 4.0–10.5)

## 2014-01-04 LAB — POCT URINALYSIS DIPSTICK
Bilirubin, UA: NEGATIVE
Blood, UA: NEGATIVE
Glucose, UA: NEGATIVE
Ketones, UA: NEGATIVE
Leukocytes, UA: NEGATIVE
Nitrite, UA: NEGATIVE
Spec Grav, UA: 1.025
Urobilinogen, UA: 0.2
pH, UA: 5.5

## 2014-01-04 LAB — HEMOGLOBIN A1C
Hgb A1c MFr Bld: 5.5 %
Mean Plasma Glucose: 111 mg/dL

## 2014-01-04 MED ORDER — ESOMEPRAZOLE MAGNESIUM 40 MG PO CPDR: 40.0000 mg | DELAYED_RELEASE_CAPSULE | Freq: Every day | ORAL | Status: DC

## 2014-01-04 MED ORDER — HYDROCHLOROTHIAZIDE 12.5 MG PO CAPS: 12.5000 mg | ORAL_CAPSULE | Freq: Every day | ORAL | Status: DC

## 2014-01-04 MED ORDER — TRIAMCINOLONE ACETONIDE 55 MCG/ACT NA AERO: 2.0000 | INHALATION_SPRAY | Freq: Every day | NASAL | Status: DC

## 2014-01-04 MED ORDER — ESTRADIOL 0.1 MG/GM VA CREA: 1.0000 | TOPICAL_CREAM | VAGINAL | Status: DC

## 2014-01-04 MED ORDER — ZOSTER VACCINE LIVE 19400 UNT/0.65ML ~~LOC~~ SOLR
0.6500 mL | Freq: Once | SUBCUTANEOUS | Status: DC
Start: 2014-01-04 — End: 2015-05-04
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
## 2014-01-05 LAB — COMPLETE METABOLIC PANEL WITH GFR
ALT: 15 U/L (ref 0–35)
AST: 21 U/L (ref 0–37)
Albumin: 3.9 g/dL (ref 3.5–5.2)
Alkaline Phosphatase: 57 U/L (ref 39–117)
BUN: 14 mg/dL (ref 6–23)
CALCIUM: 9.3 mg/dL (ref 8.4–10.5)
CHLORIDE: 106 meq/L (ref 96–112)
CO2: 26 meq/L (ref 19–32)
Creat: 0.72 mg/dL (ref 0.50–1.10)
GFR, Est Non African American: 87 mL/min
Glucose, Bld: 85 mg/dL (ref 70–99)
Potassium: 4.4 mEq/L (ref 3.5–5.3)
Sodium: 143 mEq/L (ref 135–145)
TOTAL PROTEIN: 6.2 g/dL (ref 6.0–8.3)
Total Bilirubin: 0.6 mg/dL (ref 0.2–1.2)

## 2014-01-05 LAB — LIPID PANEL
Cholesterol: 227 mg/dL — ABNORMAL HIGH (ref 0–200)
HDL: 50 mg/dL (ref >39)
LDL CALC: 141 mg/dL — AB (ref 0–99)
Total CHOL/HDL Ratio: 4.5 Ratio
Triglycerides: 178 mg/dL — ABNORMAL HIGH (ref <150)
VLDL: 36 mg/dL (ref 0–40)

## 2014-01-05 LAB — TSH: TSH: 1.239 u[IU]/mL (ref 0.350–4.500)

## 2014-01-06 LAB — PAP IG AND HPV HIGH-RISK: HPV DNA High Risk: NOT DETECTED

## 2014-01-11 ENCOUNTER — Encounter: Payer: Self-pay | Admitting: Family Medicine

## 2014-01-11 DIAGNOSIS — E7800 Pure hypercholesterolemia, unspecified: Secondary | ICD-10-CM

## 2014-01-17 MED ORDER — ROSUVASTATIN CALCIUM 5 MG PO TABS: 5.0000 mg | ORAL_TABLET | Freq: Every day | ORAL | Status: DC

## 2014-02-08 ENCOUNTER — Ambulatory Visit: Payer: Self-pay | Admitting: Family Medicine

## 2014-02-08 LAB — HM MAMMOGRAPHY: HM Mammogram: NEGATIVE

## 2014-02-10 ENCOUNTER — Telehealth: Payer: Self-pay

## 2014-02-10 NOTE — Telephone Encounter (Signed)
Pt was seen on 01/04/14 and had a CPE, per patient her insurance company has denied her Heather Ortiz charges due to the dx codes we remitted.  Are there more applicable codes that can be applied?

## 2014-02-10 NOTE — Telephone Encounter (Signed)
Spoke to pt, reviewed dx codes, explained to pt, typically a CPE code will not cover labs, this is why we used the screening codes for dx so labs will be covered. She understood and stated she would call her insurance to gain a better understanding.

## 2014-02-15 ENCOUNTER — Other Ambulatory Visit: Payer: Self-pay | Admitting: *Deleted

## 2014-02-15 DIAGNOSIS — N952 Postmenopausal atrophic vaginitis: Secondary | ICD-10-CM

## 2014-02-15 MED ORDER — ESTRADIOL 0.1 MG/GM VA CREA: 1.0000 | TOPICAL_CREAM | VAGINAL | Status: DC

## 2014-03-03 ENCOUNTER — Encounter: Payer: Self-pay | Admitting: Radiology

## 2014-07-12 ENCOUNTER — Ambulatory Visit (INDEPENDENT_AMBULATORY_CARE_PROVIDER_SITE_OTHER): Payer: BLUE CROSS/BLUE SHIELD | Admitting: Family Medicine

## 2014-07-12 ENCOUNTER — Encounter: Payer: Self-pay | Admitting: Family Medicine

## 2014-07-12 VITALS — BP 106/82 | HR 86 | Temp 98.3°F | Resp 16 | Ht 62.0 in | Wt 153.0 lb

## 2014-07-12 DIAGNOSIS — K219 Gastro-esophageal reflux disease without esophagitis: Secondary | ICD-10-CM

## 2014-07-12 DIAGNOSIS — E7800 Pure hypercholesterolemia, unspecified: Secondary | ICD-10-CM

## 2014-07-12 DIAGNOSIS — I10 Essential (primary) hypertension: Secondary | ICD-10-CM

## 2014-07-12 DIAGNOSIS — J302 Other seasonal allergic rhinitis: Secondary | ICD-10-CM

## 2014-07-12 LAB — CBC WITH DIFFERENTIAL/PLATELET
BASOS ABS: 0.1 10*3/uL (ref 0.0–0.1)
Basophils Relative: 1 % (ref 0–1)
EOS PCT: 5 % (ref 0–5)
Eosinophils Absolute: 0.3 10*3/uL (ref 0.0–0.7)
HCT: 39.5 % (ref 36.0–46.0)
Hemoglobin: 13.6 g/dL (ref 12.0–15.0)
LYMPHS PCT: 33 % (ref 12–46)
Lymphs Abs: 2.2 10*3/uL (ref 0.7–4.0)
MCH: 30.4 pg (ref 26.0–34.0)
MCHC: 34.4 g/dL (ref 30.0–36.0)
MCV: 88.4 fL (ref 78.0–100.0)
MONOS PCT: 5 % (ref 3–12)
MPV: 11.2 fL (ref 8.6–12.4)
Monocytes Absolute: 0.3 10*3/uL (ref 0.1–1.0)
NEUTROS PCT: 56 % (ref 43–77)
Neutro Abs: 3.8 10*3/uL (ref 1.7–7.7)
PLATELETS: 271 10*3/uL (ref 150–400)
RBC: 4.47 MIL/uL (ref 3.87–5.11)
RDW: 13.3 % (ref 11.5–15.5)
WBC: 6.7 10*3/uL (ref 4.0–10.5)

## 2014-07-12 LAB — COMPLETE METABOLIC PANEL WITH GFR
ALBUMIN: 4.1 g/dL (ref 3.5–5.2)
ALK PHOS: 63 U/L (ref 39–117)
ALT: 14 U/L (ref 0–35)
AST: 22 U/L (ref 0–37)
BILIRUBIN TOTAL: 0.6 mg/dL (ref 0.2–1.2)
BUN: 16 mg/dL (ref 6–23)
CALCIUM: 9.8 mg/dL (ref 8.4–10.5)
CO2: 27 meq/L (ref 19–32)
Chloride: 105 mEq/L (ref 96–112)
Creat: 0.8 mg/dL (ref 0.50–1.10)
GFR, Est African American: 88 mL/min
GFR, Est Non African American: 77 mL/min
Glucose, Bld: 86 mg/dL (ref 70–99)
Potassium: 4.5 mEq/L (ref 3.5–5.3)
Sodium: 143 mEq/L (ref 135–145)
TOTAL PROTEIN: 6.9 g/dL (ref 6.0–8.3)

## 2014-07-12 LAB — LIPID PANEL
Cholesterol: 220 mg/dL — ABNORMAL HIGH (ref 0–200)
HDL: 52 mg/dL (ref >39)
LDL Cholesterol: 135 mg/dL — ABNORMAL HIGH (ref 0–99)
Total CHOL/HDL Ratio: 4.2 Ratio
Triglycerides: 167 mg/dL — ABNORMAL HIGH (ref <150)
VLDL: 33 mg/dL (ref 0–40)

## 2014-07-12 NOTE — Progress Notes (Signed)
Subjective:    Patient ID: Heather Ortiz, female    DOB: 1946/02/09, 69 y.o.   MRN: [id number]  07/12/2014  Follow-up; Hyperlipidemia; and Hypertension   HPI This 69 y.o. female presents for six month follow-up of the following:  1. Hyperlipidemia: six month follow-up; restarted Crestor at last visit at 5mg  daily.  Kowalski recommended decreasing Crestor 5mg  1/2 daily due to recurrent arthralgias on Crestor 5mg  daily.  Now decreased to 1/2 tablet qod with no persistent R hip and knee pain.  Started current regimen since 3 weeks ago.  Denies HA/dizziness/focal weakness.  2.  HTN: no changes to BP medication at last visit.  Patient reports good compliance with medication, good tolerance to medication, and good symptom control.  Blood pressures have been elevated since last visit.  Highest reading 156/95.  Diastolic readings have been elevated.  No recent exercise due to holidays.  Discussed elevated blood pressures with Dr. Gwen PoundsKowalski; no changes to management made at visit in 05/2014.  Denies CP/palpitations/worsening leg swelling. DOE has resolved since evaluation by Dr. Gwen PoundsKowalski in 05/2014.  3. Allergic Rhinitis:  Improved recently.  Not using Nasacort due to improvement in symptoms.  Had recent cold but has improved recently as well.    4. GERD:  No recent issues. Compliance with Nexium daily. Denies n/v/d/c.  Denies melena or bloody stools; no abdominal pain.  5.  SOB: onset in 05/2014; s/p follow-up with Gwen PoundsKowalski in 05/31/2014.  S/p stress echo which was WNL.  Follow-up in 4-6 months with Gwen PoundsKowalski.  DOE has improved. Blood pressure readings have been elevated.  6.  Lower back spasms; usual December spasms; evaluated by [facility].  Prescribed muscle relaxer with improvement.  No muscle relaxer in one week.  L sided lower back pain.  No radiation into leg; Bent over at home with acute onset of pain in L buttocks with numbness.  No weakness in legs.  No b/b dysfunction.  No saddle paresthsias.    Pain now resolved.  7.  Zostavax: must receive shingles vaccine at doctor's office; pharmacy would not dispense vaccine to patient today.  Recommended going to [facility] for administration.   Review of Systems  Constitutional: Negative for fever, chills, diaphoresis and fatigue.  HENT: Negative for congestion, postnasal drip, rhinorrhea, sore throat, trouble swallowing and voice change.   Eyes: Negative for visual disturbance.  Respiratory: Negative for cough and shortness of breath.   Cardiovascular: Negative for chest pain, palpitations and leg swelling.  Gastrointestinal: Negative for nausea, vomiting, abdominal pain, diarrhea and constipation.  Endocrine: Negative for cold intolerance, heat intolerance, polydipsia, polyphagia and polyuria.  Musculoskeletal: Negative for back pain.  Neurological: Negative for dizziness, tremors, seizures, syncope, facial asymmetry, speech difficulty, weakness, light-headedness, numbness and headaches.    Past Medical History  Diagnosis Date  . Pure hypercholesterolemia   . GERD (gastroesophageal reflux disease)   . Allergic rhinitis, cause unspecified   . Unspecified essential hypertension   . Symptomatic menopausal or female climacteric states   . Unspecified gastritis and gastroduodenitis without mention of hemorrhage   . Esophagitis, unspecified   . Diverticulosis of colon (without mention of hemorrhage)   . External hemorrhoids without mention of complication   . Osteopenia    Past Surgical History  Procedure Laterality Date  . Tubal ligation    . Cardiac catheterization  2006    Normal coronary arteries. [facility]  . Cholecystectomy  06/2005  . Colonoscopy  06/06/2012    normal. [facility].  Repeat 5 years.  Allergies  Allergen Reactions  . Bee Venom     Bee Stings  . Crestor [Rosuvastatin]     Joint pain  . Influenza Vaccines     Arm Swelling  . Levaquin [Levofloxacin In D5w]     Joint pain , jittery  . Lipitor [Atorvastatin]      Joint pain  . Mobic [Meloxicam]     Flush face   . Penicillins Hives  . Pravastatin     Joint pain  . Simvastatin     Joint pain  . Sulfa Drugs Cross Reactors Hives  . Zithromax [Azithromycin] Hives  . Ceftin [Cefuroxime Axetil] Rash  . Doxycycline Rash   Current Outpatient Prescriptions  Medication Sig Dispense Refill  . aspirin 81 MG tablet Take 81 mg by mouth daily.    . Cholecalciferol (VITAMIN D-3) 1000 UNITS CAPS Take by mouth daily.    Marland Kitchen co-enzyme Q-10 30 MG capsule Take 200 mg by mouth 3 (three) times daily.    Marland Kitchen esomeprazole (NEXIUM) 40 MG capsule Take 1 capsule (40 mg total) by mouth daily before breakfast. 90 capsule 3  . estradiol (ESTRACE VAGINAL) 0.1 MG/GM vaginal cream Place 1 Applicatorful vaginally 3 (three) times a week. 127.5 g 3  . hydrochlorothiazide (MICROZIDE) 12.5 MG capsule Take 1-2 capsules (12.5-25 mg total) by mouth daily. 100 capsule 3  . loratadine (CLARITIN) 10 MG tablet Take 10 mg by mouth daily.    . Multiple Vitamins-Minerals (CENTRUM SILVER PO) Take by mouth daily.    Marland Kitchen OVER THE COUNTER MEDICATION OTC Align taking once daily    . PRESCRIPTION MEDICATION Mirvaso 0.33% cream using once daily    . rosuvastatin (CRESTOR) 5 MG tablet Take 1 tablet (5 mg total) by mouth daily at 6 PM. 90 tablet 1  . triamcinolone (NASACORT AQ) 55 MCG/ACT AERO nasal inhaler Place 2 sprays into the nose daily. (Patient not taking: Reported on 07/12/2014) 16.9 mL 12  . zoster vaccine live, PF, (ZOSTAVAX) [address] UNT/0.65ML injection Inject 19,400 Units into the skin once. 0.65 mL 0   No current facility-administered medications for this visit.       Objective:    BP 106/82 mmHg  Pulse 86  Temp(Src) 98.3 F (36.8 C) (Oral)  Resp 16  Ht  (1.575 m)  Wt 153 lb (69.4 kg)  BMI 27.98 kg/m2  SpO2 97% Physical Exam  Constitutional: She is oriented to person, place, and time. She appears well-developed and well-nourished. No distress.  HENT:  Head: Normocephalic  and atraumatic.  Right Ear: External ear normal.  Left Ear: External ear normal.  Nose: Nose normal.  Mouth/Throat: Oropharynx is clear and moist.  Eyes: Conjunctivae and EOM are normal. Pupils are equal, round, and reactive to light.  Neck: Normal range of motion. Neck supple. Carotid bruit is not present. No thyromegaly present.  Cardiovascular: Normal rate, regular rhythm, normal heart sounds and intact distal pulses.  Exam reveals no gallop and no friction rub.   No murmur heard. Pulmonary/Chest: Effort normal and breath sounds normal. She has no wheezes. She has no rales.  Abdominal: Soft. Bowel sounds are normal. She exhibits no distension and no mass. There is no tenderness. There is no rebound and no guarding.  Lymphadenopathy:    She has no cervical adenopathy.  Neurological: She is alert and oriented to person, place, and time. No cranial nerve deficit.  Skin: Skin is warm and dry. No rash noted. She is not diaphoretic. No erythema. No pallor.  Psychiatric:  She has a normal mood and affect. Her behavior is normal.   Results for orders placed or performed in visit on 03/03/14  HM MAMMOGRAPHY  Result Value Ref Range   HM Mammogram negative        Assessment & Plan:   1. Essential hypertension, benign   2. Elevated cholesterol   3. Other seasonal allergic rhinitis   4. Gastroesophageal reflux disease, esophagitis presence not specified      1. HTN: worsening readings in past six months; no changes to management at this time; can take an additional HCTZ if diastolic > 90. Obtain labs; no changes; currently asymptomatic. 2.  Hypercholesterolemia: stable; only tolerating Crestor  qod at this time; obtain labs. 3.  Allergic Rhinitis: controlled and improved; currently not needing Nasacort. 4.  GERD: controlled; no changes to management. 5. DOE: New and now resolved; s/p stress echo by cardiology that was WNL; follow-up with Gwen Pounds in 4-6 months.    No orders of the  defined types were placed in this encounter.    Return in about 6 months (around 01/10/2015) for complete physical examiniation.    Viviana Trimble Paulita Fujita, M.D. [facility] [address] [place], [place]  [address] [phone] phone [phone] fax

## 2014-07-12 NOTE — Patient Instructions (Signed)

## 2014-07-19 ENCOUNTER — Other Ambulatory Visit: Payer: Self-pay | Admitting: Family Medicine

## 2014-07-31 ENCOUNTER — Encounter: Payer: Self-pay | Admitting: Family Medicine

## 2014-09-23 ENCOUNTER — Emergency Department: Payer: Self-pay | Admitting: Emergency Medicine

## 2014-09-26 ENCOUNTER — Telehealth: Payer: Self-pay

## 2014-09-26 NOTE — Telephone Encounter (Signed)
Please call patient to schedule hospital follow-up within two weeks.  You can schedule her at 4:15 or 4:30 at end of the day.

## 2014-09-26 NOTE — Telephone Encounter (Signed)
Pt would like Dr. Katrinka BlazingSmith to know [place] [facility] [place] [facility] [facility] [place]. Her potassium was 2.8. Please advise at  [phone]

## 2014-10-23 ENCOUNTER — Ambulatory Visit (INDEPENDENT_AMBULATORY_CARE_PROVIDER_SITE_OTHER): Payer: BLUE CROSS/BLUE SHIELD | Admitting: Family Medicine

## 2014-10-23 ENCOUNTER — Encounter: Payer: Self-pay | Admitting: Family Medicine

## 2014-10-23 VITALS — BP 116/72 | HR 73 | Temp 98.4°F | Resp 16 | Ht 62.0 in | Wt 154.8 lb

## 2014-10-23 DIAGNOSIS — E876 Hypokalemia: Secondary | ICD-10-CM

## 2014-10-23 DIAGNOSIS — I10 Essential (primary) hypertension: Secondary | ICD-10-CM

## 2014-10-23 DIAGNOSIS — R55 Syncope and collapse: Secondary | ICD-10-CM

## 2014-10-23 DIAGNOSIS — J301 Allergic rhinitis due to pollen: Secondary | ICD-10-CM

## 2014-10-23 DIAGNOSIS — E860 Dehydration: Secondary | ICD-10-CM

## 2014-10-23 DIAGNOSIS — R197 Diarrhea, unspecified: Secondary | ICD-10-CM | POA: Diagnosis not present

## 2014-10-23 LAB — CBC WITH DIFFERENTIAL/PLATELET
BASOS PCT: 1 % (ref 0–1)
Basophils Absolute: 0.1 10*3/uL (ref 0.0–0.1)
Eosinophils Absolute: 0.2 10*3/uL (ref 0.0–0.7)
Eosinophils Relative: 4 % (ref 0–5)
HEMATOCRIT: 37.4 % (ref 36.0–46.0)
Hemoglobin: 12.4 g/dL (ref 12.0–15.0)
Lymphocytes Relative: 34 % (ref 12–46)
Lymphs Abs: 2 10*3/uL (ref 0.7–4.0)
MCH: 30 pg (ref 26.0–34.0)
MCHC: 33.2 g/dL (ref 30.0–36.0)
MCV: 90.3 fL (ref 78.0–100.0)
MONO ABS: 0.5 10*3/uL (ref 0.1–1.0)
MONOS PCT: 8 % (ref 3–12)
MPV: 11.6 fL (ref 8.6–12.4)
Neutro Abs: 3.1 10*3/uL (ref 1.7–7.7)
Neutrophils Relative %: 53 % (ref 43–77)
PLATELETS: 257 10*3/uL (ref 150–400)
RBC: 4.14 MIL/uL (ref 3.87–5.11)
RDW: 14 % (ref 11.5–15.5)
WBC: 5.9 10*3/uL (ref 4.0–10.5)

## 2014-10-23 LAB — COMPREHENSIVE METABOLIC PANEL
ALBUMIN: 3.9 g/dL (ref 3.5–5.2)
ALT: 14 U/L (ref 0–35)
AST: 21 U/L (ref 0–37)
Alkaline Phosphatase: 54 U/L (ref 39–117)
BILIRUBIN TOTAL: 0.4 mg/dL (ref 0.2–1.2)
BUN: 16 mg/dL (ref 6–23)
CO2: 26 meq/L (ref 19–32)
CREATININE: 0.8 mg/dL (ref 0.50–1.10)
Calcium: 9.3 mg/dL (ref 8.4–10.5)
Chloride: 108 mEq/L (ref 96–112)
Glucose, Bld: 97 mg/dL (ref 70–99)
Potassium: 4.4 mEq/L (ref 3.5–5.3)
Sodium: 143 mEq/L (ref 135–145)
Total Protein: 6.7 g/dL (ref 6.0–8.3)

## 2014-10-23 MED ORDER — MONTELUKAST SODIUM 10 MG PO TABS: 10.0000 mg | ORAL_TABLET | Freq: Every day | ORAL | Status: DC

## 2014-10-23 NOTE — Patient Instructions (Signed)
Cell phone number [phone]

## 2014-10-23 NOTE — Progress Notes (Signed)
Subjective:    Patient ID: Heather Ortiz, female    DOB: May 28, 1946, 69 y.o.   MRN: [id number]  10/23/2014  Hospitalization Follow-up   HPI This 69 y.o. female presents for evaluation of   1.  Acute sinusitis: evaluated by [facility]; placed on Prednisone which caused palpitations.  On 09-20-14.  Started with sinus congestion, ear infection; also had diarrhea.  Going down to [place], felt like R ear had poked needle into on plane.  No congestion until right before returning from [place].  Allergies are really bad; taking Claritin and using Asmacort OTC.  2.  Syncopal event: [facility]  Called neighbor because husband was out of town.  Neighbor called EMS; BP was good; sugar was good;skin was good; offered transport but pt declined transport.  Called office; did not offer to see anyone else.  [facility]; [facility] due to head [facility].  BP stayed 95/60 in ED.  Hit head three times with falls/syncopal event.  Gave 2 bags of fluid; potassium was 2.8. Ran EKG, CXR, blood work, urinalysis, stool studies because of diarrheal illness.  Spent three hours there.  Recommend drinking gatorade; recommended having potassium checked two days later.  Called [facility] office; saw nurse practitioner and she had wear heart monitor for 24 hours because pt had tachycardia.  Checked potassium level.  Potassium was back up to 4.5.  S/p echo and carotid dopplers on 10/09/14.  [facility] said all was good.  Stopped HCTZ and did not replace with anything.  BP has been fairly good; diastolic level varies from 72-90.  No further follow with [facility]als[facility] until 04-2015.  Very short syncopal events.  No b/b incontinence.  Had fever to 102 during acute illness; husband was also sick.  No headaches, confusion, dizziness, amnesia, blurred vision, nausea.  3. HTN: [facility] stopped HCTZ; did not replace with any other medications.  Home BPs running  130-140/80-90.  Denies CP/palp/SOB/leg swelling.  4.  Allergic rhinitis:  Taking Claritin, Nasacort.  Suffering with persistent cough that is keeping patient up at night.  Taking Robitussin DM qhs due to nighttime cough. No fever/chills/sweats; no sinus pressure.  No ear pian or sore throat.  +rhinorrhea; +nasal congestion; +PND.    5. Diarrhea: started after starting Prednisone.  Has now resolved; ED completed stool studies due to recent travel to [place].  No persistent diarrhea.  Review of Systems  Constitutional: Negative for fever, chills, diaphoresis and fatigue.  HENT: Positive for congestion, postnasal drip, rhinorrhea and sneezing. Negative for ear pain, sinus pressure, sore throat, trouble swallowing and voice change.   Eyes: Negative for photophobia and visual disturbance.  Respiratory: Negative for cough and shortness of breath.   Cardiovascular: Negative for chest pain, palpitations and leg swelling.  Gastrointestinal: Negative for nausea, vomiting, abdominal pain, diarrhea, constipation, blood in stool, abdominal distention, anal bleeding and rectal pain.  Endocrine: Negative for cold intolerance, heat intolerance, polydipsia, polyphagia and polyuria.  Skin: Negative for rash and wound.  Neurological: Positive for syncope. Negative for dizziness, tremors, seizures, facial asymmetry, speech difficulty, weakness, light-headedness, numbness and headaches.  Psychiatric/Behavioral: Negative for dysphoric mood. The patient is not nervous/anxious.     Past Medical History  Diagnosis Date  . Pure hypercholesterolemia   . GERD (gastroesophageal reflux disease)   . Allergic rhinitis, cause unspecified   . Unspecified essential hypertension   . Symptomatic menopausal or female climacteric states   .  Unspecified gastritis and gastroduodenitis without mention of hemorrhage   . Esophagitis, unspecified   . Diverticulosis of colon (without mention of hemorrhage)   . External hemorrhoids  without mention of complication   . Osteopenia    Past Surgical History  Procedure Laterality Date  . Tubal ligation    . Cardiac catheterization  2006    Normal coronary arteries. [facility]  . Cholecystectomy  06/2005  . Colonoscopy  06/06/2012    normal. [facility].  Repeat 5 years.   Allergies  Allergen Reactions  . Prednisone Palpitations  . Bee Venom     Bee Stings  . Crestor [Rosuvastatin]     Joint pain  . Influenza Vaccines     Arm Swelling  . Levaquin [Levofloxacin In D5w]     Joint pain , jittery  . Lipitor [Atorvastatin]     Joint pain  . Mobic [Meloxicam]     Flush face   . Penicillins Hives  . Pravastatin     Joint pain  . Simvastatin     Joint pain  . Sulfa Drugs Cross Reactors Hives  . Zithromax [Azithromycin] Hives  . Ceftin [Cefuroxime Axetil] Rash  . Doxycycline Rash   Current Outpatient Prescriptions  Medication Sig Dispense Refill  . aspirin 81 MG tablet Take 81 mg by mouth daily.    . Cholecalciferol (VITAMIN D-3) 1000 UNITS CAPS Take by mouth daily.    Marland Kitchen co-enzyme Q-10 30 MG capsule Take 200 mg by mouth 3 (three) times daily.    Marland Kitchen esomeprazole (NEXIUM) 40 MG capsule Take 1 capsule (40 mg total) by mouth daily before breakfast. 90 capsule 3  . estradiol (ESTRACE VAGINAL) 0.1 MG/GM vaginal cream Place 1 Applicatorful vaginally 3 (three) times a week. 127.5 g 3  . loratadine (CLARITIN) 10 MG tablet Take 10 mg by mouth daily.    . Multiple Vitamins-Minerals (CENTRUM SILVER PO) Take by mouth daily.    Marland Kitchen OVER THE COUNTER MEDICATION OTC Align taking once daily    . PRESCRIPTION MEDICATION Mirvaso 0.33% cream using once daily    . triamcinolone (NASACORT AQ) 55 MCG/ACT AERO nasal inhaler Place 2 sprays into the nose daily. 16.9 mL 12  . CRESTOR 5 MG tablet TAKE 1 TABLET DAILY AT 6 P.M. (Patient not taking: Reported on 10/23/2014) 90 tablet 1  . hydrochlorothiazide (MICROZIDE) 12.5 MG capsule Take 1-2 capsules (12.5-25 mg total) by mouth daily. (Patient not  taking: Reported on 10/23/2014) 100 capsule 3  . montelukast (SINGULAIR) 10 MG tablet Take 1 tablet (10 mg total) by mouth at bedtime. 30 tablet 5  . zoster vaccine live, PF, (ZOSTAVAX) 16109 UNT/0.65ML injection Inject 19,400 Units into the skin once. (Patient not taking: Reported on 10/23/2014) 0.65 mL 0   No current facility-administered medications for this visit.       Objective:    BP 116/72 mmHg  Pulse 73  Temp(Src) 98.4 F (36.9 C) (Oral)  Resp 16  Ht  (1.575 m)  Wt 154 lb 12.8 oz (70.217 kg)  BMI 28.31 kg/m2  SpO2 99% Physical Exam  Constitutional: She is oriented to person, place, and time. She appears well-developed and well-nourished. No distress.  HENT:  Head: Normocephalic and atraumatic.  Right Ear: External ear normal.  Left Ear: External ear normal.  Nose: Nose normal.  Mouth/Throat: Oropharynx is clear and moist.  Eyes: Conjunctivae and EOM are normal. Pupils are equal, round, and reactive to light.  Neck: Normal range of motion. Neck supple. Carotid bruit is  not present. No thyromegaly present.  Cardiovascular: Normal rate, regular rhythm, normal heart sounds and intact distal pulses.  Exam reveals no gallop and no friction rub.   No murmur heard. Pulmonary/Chest: Effort normal and breath sounds normal. She has no wheezes. She has no rales.  Abdominal: Soft. Bowel sounds are normal. She exhibits no distension and no mass. There is no tenderness. There is no rebound and no guarding.  Lymphadenopathy:    She has no cervical adenopathy.  Neurological: She is alert and oriented to person, place, and time. No cranial nerve deficit. She exhibits normal muscle tone. She displays a negative Romberg sign. Coordination normal.  Skin: Skin is warm and dry. No rash noted. She is not diaphoretic. No erythema. No pallor.  Psychiatric: She has a normal mood and affect. Her behavior is normal. Judgment and thought content normal.        Assessment & Plan:   1.  Syncope and collapse   2. Dehydration   3. Hypokalemia   4. Diarrhea   5. Allergic rhinitis due to pollen   6. Essential hypertension, benign     1. Syncope:  New.  S/p ED evaluation; secondary to dehydration.  S/p cardiology evaluation with 2D-echo and Holter monitor.  No recurrence since cardiology evaluation.  Feeling well.  No neurological symptoms to suggest neurological process to syncope.   2.  Dehydration: New. Secondary to diarrhea and acute illness; now resolved; three syncopal events; s/p ED evaluation and received 2 liters of ivf.  Diuretic stopped by cardiology. 3.  Hypokalemia: New.  Associated with diarrheal illness; replaced in ED; repeated by cardiology; obtain labs.  HCTZ discontinued by cardiology. 4.  Diarrhea: New and now resolved; onset after starting prednisone for URI symptoms; now resolved; s/p stool studies by [facility] ED due to recent travel to [place].   5.  Allergic Rhinitis: uncontrolled; suffering with significant cough; continue Claritin and Nasacort; add Singulair  daily. 6.  HTN: stable off of medication.  Continue to monitor closely at home.  RTC three months. 7. Hypercholesterolemia: pt has stopped Crestor due to myalgias and arthralgias.   Meds ordered this encounter  Medications  . montelukast (SINGULAIR) 10 MG tablet    Sig: Take 1 tablet (10 mg total) by mouth at bedtime.    Dispense:  30 tablet    Refill:  5    No Follow-up on file.    Sakura Denis Paulita Fujita, M.D. [facility] [address] [place], [place]  [address] [phone] phone [phone] fax

## 2015-01-24 ENCOUNTER — Encounter: Payer: BLUE CROSS/BLUE SHIELD | Admitting: Family Medicine

## 2015-02-23 ENCOUNTER — Other Ambulatory Visit: Payer: Self-pay | Admitting: Family Medicine

## 2015-02-28 ENCOUNTER — Encounter: Payer: BLUE CROSS/BLUE SHIELD | Admitting: Family Medicine

## 2015-04-11 ENCOUNTER — Encounter: Payer: Self-pay | Admitting: Family Medicine

## 2015-04-13 ENCOUNTER — Encounter: Payer: Self-pay | Admitting: Family Medicine

## 2015-05-04 ENCOUNTER — Ambulatory Visit (INDEPENDENT_AMBULATORY_CARE_PROVIDER_SITE_OTHER): Payer: BLUE CROSS/BLUE SHIELD

## 2015-05-04 ENCOUNTER — Ambulatory Visit (INDEPENDENT_AMBULATORY_CARE_PROVIDER_SITE_OTHER): Payer: BLUE CROSS/BLUE SHIELD | Admitting: Family Medicine

## 2015-05-04 ENCOUNTER — Encounter: Payer: Self-pay | Admitting: Family Medicine

## 2015-05-04 VITALS — BP 154/83 | HR 66 | Temp 97.9°F | Resp 16 | Ht 62.0 in | Wt 159.4 lb

## 2015-05-04 DIAGNOSIS — E2839 Other primary ovarian failure: Secondary | ICD-10-CM

## 2015-05-04 DIAGNOSIS — K219 Gastro-esophageal reflux disease without esophagitis: Secondary | ICD-10-CM

## 2015-05-04 DIAGNOSIS — E7800 Pure hypercholesterolemia, unspecified: Secondary | ICD-10-CM | POA: Diagnosis not present

## 2015-05-04 DIAGNOSIS — M79604 Pain in right leg: Secondary | ICD-10-CM

## 2015-05-04 DIAGNOSIS — J302 Other seasonal allergic rhinitis: Secondary | ICD-10-CM | POA: Diagnosis not present

## 2015-05-04 DIAGNOSIS — F4321 Adjustment disorder with depressed mood: Secondary | ICD-10-CM | POA: Diagnosis not present

## 2015-05-04 DIAGNOSIS — Z1231 Encounter for screening mammogram for malignant neoplasm of breast: Secondary | ICD-10-CM | POA: Diagnosis not present

## 2015-05-04 DIAGNOSIS — M7061 Trochanteric bursitis, right hip: Secondary | ICD-10-CM | POA: Diagnosis not present

## 2015-05-04 DIAGNOSIS — R252 Cramp and spasm: Secondary | ICD-10-CM

## 2015-05-04 DIAGNOSIS — I10 Essential (primary) hypertension: Secondary | ICD-10-CM

## 2015-05-04 DIAGNOSIS — Z1159 Encounter for screening for other viral diseases: Secondary | ICD-10-CM | POA: Diagnosis not present

## 2015-05-04 LAB — CBC WITH DIFFERENTIAL/PLATELET
Basophils Absolute: 0.1 10*3/uL (ref 0.0–0.1)
Basophils Relative: 1 % (ref 0–1)
EOS ABS: 0.2 10*3/uL (ref 0.0–0.7)
EOS PCT: 3 % (ref 0–5)
HCT: 39.4 % (ref 36.0–46.0)
Hemoglobin: 13 g/dL (ref 12.0–15.0)
Lymphocytes Relative: 43 % (ref 12–46)
Lymphs Abs: 2.7 10*3/uL (ref 0.7–4.0)
MCH: 29.3 pg (ref 26.0–34.0)
MCHC: 33 g/dL (ref 30.0–36.0)
MCV: 88.7 fL (ref 78.0–100.0)
MONOS PCT: 7 % (ref 3–12)
MPV: 12 fL (ref 8.6–12.4)
Monocytes Absolute: 0.4 10*3/uL (ref 0.1–1.0)
Neutro Abs: 2.9 10*3/uL (ref 1.7–7.7)
Neutrophils Relative %: 46 % (ref 43–77)
PLATELETS: 249 10*3/uL (ref 150–400)
RBC: 4.44 MIL/uL (ref 3.87–5.11)
RDW: 13.5 % (ref 11.5–15.5)
WBC: 6.3 10*3/uL (ref 4.0–10.5)

## 2015-05-04 LAB — COMPREHENSIVE METABOLIC PANEL
ALT: 19 U/L (ref 6–29)
AST: 23 U/L (ref 10–35)
Albumin: 4.3 g/dL (ref 3.6–5.1)
Alkaline Phosphatase: 72 U/L (ref 33–130)
BUN: 16 mg/dL (ref 7–25)
CO2: 26 mmol/L (ref 20–31)
CREATININE: 0.81 mg/dL (ref 0.50–0.99)
Calcium: 9.4 mg/dL (ref 8.6–10.4)
Chloride: 104 mmol/L (ref 98–110)
Glucose, Bld: 83 mg/dL (ref 65–99)
Potassium: 3.9 mmol/L (ref 3.5–5.3)
SODIUM: 138 mmol/L (ref 135–146)
Total Bilirubin: 0.7 mg/dL (ref 0.2–1.2)
Total Protein: 6.9 g/dL (ref 6.1–8.1)

## 2015-05-04 LAB — LIPID PANEL
CHOL/HDL RATIO: 4.1 ratio (ref <=5.0)
Cholesterol: 266 mg/dL — ABNORMAL HIGH (ref 125–200)
HDL: 65 mg/dL (ref >=46)
LDL CALC: 174 mg/dL — AB (ref <130)
Triglycerides: 136 mg/dL (ref <150)
VLDL: 27 mg/dL (ref <30)

## 2015-05-04 NOTE — Progress Notes (Signed)
Subjective:    Patient ID: Heather Ortiz, female    DOB: 11/02/1945, 69 y.o.   MRN: [id number]  05/04/2015  intermittant pain in both legs   HPI This 69 y.o. female presents for six month follow-up:    1.  Legs cramping and pain: onset in 08/2014; stopped Crestor; tried decreasing Crestor to 1/2 tablet and qod.  In past two months, legs wake up at night with Charlie horse/cramping.  Nighttime awakening.  At other times, has R sciatica pain with nighttime awakening.  Charlie horse is rare.  R aching in legs is every night.  No daytime symptoms.  2-3 times per night.  Cries out with it.  Hits hard and sharp; mostly R leg but can get it in L leg.  Worried about potassium.  Drinks Wellsite geologist.  No change in exercise regimen; minimal exercise.  No lower back pain. Dull ache from hip to foot R.  No burning/numbness/tingling.  No weakness; normal b/b function.  Duration 1 hour.  Taking Ibuprofen PRN.  Also taking with Tylenol.  Drinking plenty of fluids during the day.  Drinks 4 16 ounce water each day in addition to Green Tea.    2. HTN; blood pressure really elevated in past few months; Dr. Gwen Pounds started Amlodipine 2.5mg  daily three months ago; BP at home running 140-150/80s.    Review of Systems  Constitutional: Negative for fever, chills, diaphoresis and fatigue.  Eyes: Negative for visual disturbance.  Respiratory: Negative for cough and shortness of breath.   Cardiovascular: Negative for chest pain, palpitations and leg swelling.  Gastrointestinal: Negative for nausea, vomiting, abdominal pain, diarrhea and constipation.  Endocrine: Negative for cold intolerance, heat intolerance, polydipsia, polyphagia and polyuria.  Musculoskeletal: Positive for myalgias. Negative for back pain, joint swelling and gait problem.  Neurological: Negative for dizziness, tremors, seizures, syncope, facial asymmetry, speech difficulty, weakness, light-headedness, numbness and headaches.    Past Medical  History  Diagnosis Date  . Pure hypercholesterolemia   . GERD (gastroesophageal reflux disease)   . Allergic rhinitis, cause unspecified   . Unspecified essential hypertension   . Symptomatic menopausal or female climacteric states   . Unspecified gastritis and gastroduodenitis without mention of hemorrhage   . Esophagitis, unspecified   . Diverticulosis of colon (without mention of hemorrhage)   . External hemorrhoids without mention of complication   . Osteopenia    Past Surgical History  Procedure Laterality Date  . Tubal ligation    . Cardiac catheterization  2006    Normal coronary arteries. [facility]  . Cholecystectomy  06/2005  . Colonoscopy  06/06/2012    normal. [facility].  Repeat 5 years.   Allergies  Allergen Reactions  . Prednisone Palpitations  . Bee Venom     Bee Stings  . Crestor [Rosuvastatin]     Joint pain  . Influenza Vaccines     Arm Swelling  . Levaquin [Levofloxacin In D5w]     Joint pain , jittery  . Lipitor [Atorvastatin]     Joint pain  . Mobic [Meloxicam]     Flush face   . Penicillins Hives  . Pravastatin     Joint pain  . Simvastatin     Joint pain  . Sulfa Drugs Cross Reactors Hives  . Zithromax [Azithromycin] Hives  . Ceftin [Cefuroxime Axetil] Rash  . Doxycycline Rash   Current Outpatient Prescriptions  Medication Sig Dispense Refill  . amLODipine (NORVASC) 2.5 MG tablet Take 2.5 mg by mouth daily.    Marland Kitchen  aspirin 81 MG tablet Take 81 mg by mouth daily.    . Cholecalciferol (VITAMIN D-3) 1000 UNITS CAPS Take by mouth daily.    Marland Kitchen co-enzyme Q-10 30 MG capsule Take 200 mg by mouth 3 (three) times daily.    Marland Kitchen esomeprazole (NEXIUM) 40 MG capsule TAKE 1 CAPSULE DAILY BEFORE BREAKFAST. 90 capsule 0  . estradiol (ESTRACE VAGINAL) 0.1 MG/GM vaginal cream Place 1 Applicatorful vaginally 3 (three) times a week. 127.5 g 3  . loratadine (CLARITIN) 10 MG tablet Take 10 mg by mouth daily.    . montelukast (SINGULAIR) 10 MG tablet Take 1 tablet (10 mg  total) by mouth at bedtime. 30 tablet 5  . Multiple Vitamins-Minerals (CENTRUM SILVER PO) Take by mouth daily.    Marland Kitchen OVER THE COUNTER MEDICATION OTC Align taking once daily    . PRESCRIPTION MEDICATION Mirvaso 0.33% cream using once daily    . triamcinolone (NASACORT AQ) 55 MCG/ACT AERO nasal inhaler Place 2 sprays into the nose daily. 16.9 mL 12   No current facility-administered medications for this visit.   Social History   Social History  . Marital Status: Married    Spouse Name: N/A  . Number of Children: 3  . Years of Education: 12   Occupational History  . retired     Education officer, community [employer]   Social History Main Topics  . Smoking status: Never Smoker   . Smokeless tobacco: Not on file  . Alcohol Use: Yes     Comment: once weekly  . Drug Use: No  . Sexual Activity: Yes   Other Topics Concern  . Not on file   Social History Narrative   Always uses seat belts.    Married x 46 years; happily; no abuse.       Children:  3 children and 8 grandchildren.      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        Tobacco; never      Alcohol: rare      Drugs: none     Caffeine Use: Coffee, tea, 3 servings / day.       Exercise: Moderate, 4 x weeg walking; Recently joined a gym.      Guns: none   Family History  Problem Relation Age of Onset  . Hypertension Mother   . Heart disease Mother     CAD/valve replacement/CABG  . Stroke Mother 57    TIA's multiple  . Cancer Mother     bladder  . Glaucoma Mother   . Aortic aneurysm Mother   . Emphysema Father   . Glaucoma Father     also Mother  . COPD Father   . Hyperlipidemia Brother   . Meniere's disease Brother   . Aortic aneurysm    . Hypertension Sister        Objective:    BP 154/83 mmHg  Pulse 66  Temp(Src) 97.9 F (36.6 C) (Oral)  Resp 16  Ht 5\' 2"  (1.575 m)  Wt 159 lb 6.4 oz (72.303 kg)  BMI 29.15 kg/m2 Physical Exam  Constitutional: She is oriented to person, place, and time. She appears  well-developed and well-nourished. No distress.  HENT:  Head: Normocephalic and atraumatic.  Right Ear: External ear normal.  Left Ear: External ear normal.  Nose: Nose normal.  Mouth/Throat: Oropharynx is clear and moist.  Eyes: Conjunctivae and EOM are normal. Pupils are equal, round, and reactive to light.  Neck: Normal range of motion. Neck supple. Carotid  bruit is not present. No thyromegaly present.  Cardiovascular: Normal rate, regular rhythm, normal heart sounds and intact distal pulses.  Exam reveals no gallop and no friction rub.   No murmur heard. Pulmonary/Chest: Effort normal and breath sounds normal. She has no wheezes. She has no rales.  Abdominal: Soft. Bowel sounds are normal. She exhibits no distension and no mass. There is no tenderness. There is no rebound and no guarding.  Musculoskeletal:       Right hip: She exhibits tenderness and bony tenderness. She exhibits normal range of motion, normal strength and no swelling.       Right knee: Normal. She exhibits normal range of motion and no swelling.       Right ankle: Normal. She exhibits normal range of motion and no swelling. No tenderness.       Lumbar back: Normal. She exhibits normal range of motion, no tenderness, no bony tenderness, no pain, no spasm and normal pulse.       Right upper leg: Normal. She exhibits no tenderness and no bony tenderness.       Right lower leg: Normal. She exhibits no tenderness, no bony tenderness, no swelling and no edema.  R HIP: +TTP along lateral aspect of hip at bursa.  Lymphadenopathy:    She has no cervical adenopathy.  Neurological: She is alert and oriented to person, place, and time. No cranial nerve deficit.  Skin: Skin is warm and dry. No rash noted. She is not diaphoretic. No erythema. No pallor.  Psychiatric: She has a normal mood and affect. Her behavior is normal.    [facility] reading (PRIMARY) by  Dr. Katrinka Blazing.  R HIP: arthritic changes R hip.      Assessment & Plan:   1.  Essential hypertension, benign   2. Gastroesophageal reflux disease, esophagitis presence not specified   3. Pure hypercholesterolemia   4. Grief reaction   5. Other seasonal allergic rhinitis   6. Need for hepatitis C screening test   7. Right leg pain   8. Encounter for screening mammogram for breast cancer   9. Estrogen deficiency     1.  HTN: uncontrolled; increase Amlodipine 2.5mg  to two daily; follow-up with cardiology in upcoming month. 2.  GERD: stable; continue current medications. 3. Hyperlipidemia: uncontrolled due to non-compliance with statin due to myalgias. 4.  R leg pain: New.  Ddx sciatica versus R greater trochanteric bursitis.  S/p R hip xray that was negative.  Recommend Aleve bid for two weeks; home exercise program provided. If no improvement in two weeks, call for ortho referral.  Also will obtain labs yet feel hypokalemia unlikely cause of symptoms. 5. Screening HIV; obtain. 6. Screening for Hepatitis C: obtain. 7.  Breast cancer screening: refer for mammogram. 8. Estrogen deficiency: refer for bone density scan. 9.  Leg cramps: obtain labs; encourage regular exercise with stretching; encourage hydration.    Orders Placed This Encounter  Procedures  . DG HIP UNILAT W OR W/O PELVIS 2-3 VIEWS RIGHT    Standing Status: Future     Number of Occurrences: 1     Standing Expiration Date: 05/03/2016    Order Specific Question:  Reason for Exam (SYMPTOM  OR DIAGNOSIS REQUIRED)    Answer:  R hip pain that radiates into R calf    Order Specific Question:  Preferred imaging location?    Answer:  External  . MM DIGITAL SCREENING BILATERAL    Standing Status: Future     Number of  Occurrences:      Standing Expiration Date: 07/03/2016    Order Specific Question:  Reason for Exam (SYMPTOM  OR DIAGNOSIS REQUIRED)    Answer:  annual screening    Order Specific Question:  Preferred imaging location?    Answer:  [facility]  . DG Bone Density    Standing Status:  Future     Number of Occurrences:      Standing Expiration Date: 07/03/2016    Order Specific Question:  Reason for Exam (SYMPTOM  OR DIAGNOSIS REQUIRED)    Answer:  annual screening    Order Specific Question:  Preferred imaging location?    Answer:  [facility]  . CBC with Differential/Platelet  . Comprehensive metabolic panel    Order Specific Question:  Has the patient fasted?    Answer:  Yes  . Lipid panel    Order Specific Question:  Has the patient fasted?    Answer:  Yes  . Hepatitis C antibody   Meds ordered this encounter  Medications  . amLODipine (NORVASC) 2.5 MG tablet    Sig: Take 2.5 mg by mouth daily.    No Follow-up on file.    Zeinab Rodwell Paulita FujitaMartin Octavian Godek, M.D. [facility]  [place] [address] [place], [place]  [id number] [phone] phone [phone] fax

## 2015-05-04 NOTE — Patient Instructions (Signed)
Generic Hip Exercises RANGE OF MOTION (ROM) AND STRETCHING EXERCISES  These exercises may help you when beginning to rehabilitate your injury. Doing them too aggressively can worsen your condition. Complete them slowly and gently. Your symptoms may resolve with or without further involvement from your physician, physical therapist or athletic trainer. While completing these exercises, remember:   Restoring tissue flexibility helps normal motion to return to the joints. This allows healthier, less painful movement and activity.  An effective stretch should be held for at least 30 seconds.  A stretch should never be painful. You should only feel a gentle lengthening or release in the stretched tissue. If these stretches worsen your symptoms even when done gently, consult your physician, physical therapist or athletic trainer. STRETCH - Hamstrings, Supine   Lie on your back. Loop a belt or towel over the ball of your right / left foot.  Straighten your right / left knee and slowly pull on the belt to raise your leg. Do not allow the right / left knee to bend. Keep your opposite leg flat on the floor.  Raise the leg until you feel a gentle stretch behind your right / left knee or thigh. Hold this position for __________ seconds. Repeat __________ times. Complete this stretch __________ times per day.  STRETCH - Hip Rotators   Lie on your back on a firm surface. Grasp your right / left knee with your right / left hand and your ankle with your opposite hand.  Keeping your hips and shoulders firmly planted, gently pull your right / left knee and rotate your lower leg toward your opposite shoulder until you feel a stretch in your buttocks.  Hold this stretch for __________ seconds. Repeat this stretch __________ times. Complete this stretch __________ times per day. STRETCH - Hamstrings/Adductors, V-Sit   Sit on the floor with your legs extended in a large "V," keeping your knees straight.  With  your head and chest upright, bend at your waist reaching for your right foot to stretch your left adductors.  You should feel a stretch in your left inner thigh. Hold for __________ seconds.  Return to the upright position to relax your leg muscles.  Continuing to keep your chest upright, bend straight forward at your waist to stretch your hamstrings.  You should feel a stretch behind both of your thighs and/or knees. Hold for __________ seconds.  Return to the upright position to relax your leg muscles.  Repeat steps 2 through 4 for opposite leg. Repeat __________ times. Complete this exercise __________ times per day.  STRETCHING - Hip Flexors, Lunge  Half kneel with your right / left knee on the floor and your opposite knee bent and directly over your ankle.  Keep good posture with your head over your shoulders. Tighten your buttocks to point your tailbone downward; this will prevent your back from arching too much.  You should feel a gentle stretch in the front of your thigh and/or hip. If you do not feel any resistance, slightly slide your opposite foot forward and then slowly lunge forward so your knee once again lines up over your ankle. Be sure your tailbone remains pointed downward.  Hold this stretch for __________ seconds. Repeat __________ times. Complete this stretch __________ times per day. STRENGTHENING EXERCISES These exercises may help you when beginning to rehabilitate your injury. They may resolve your symptoms with or without further involvement from your physician, physical therapist or athletic trainer. While completing these exercises, remember:     Muscles can gain both the endurance and the strength needed for everyday activities through controlled exercises.  Complete these exercises as instructed by your physician, physical therapist or athletic trainer. Progress the resistance and repetitions only as guided.  You may experience muscle soreness or fatigue, but  the pain or discomfort you are trying to eliminate should never worsen during these exercises. If this pain does worsen, stop and make certain you are following the directions exactly. If the pain is still present after adjustments, discontinue the exercise until you can discuss the trouble with your clinician. STRENGTH - Hip Extensors, Bridge   Lie on your back on a firm surface. Bend your knees and place your feet flat on the floor.  Tighten your buttocks muscles and lift your bottom off the floor until your trunk is level with your thighs. You should feel the muscles in your buttocks and back of your thighs working. If you do not feel these muscles, slide your feet 1-2 inches further away from your buttocks.  Hold this position for __________ seconds.  Slowly lower your hips to the starting position and allow your buttock muscles relax completely before beginning the next repetition.  If this exercise is too easy, you may cross your arms over your chest. Repeat __________ times. Complete this exercise __________ times per day.  STRENGTH - Hip Abductors, Straight Leg Raises  Be aware of your form throughout the entire exercise so that you exercise the correct muscles. Sloppy form means that you are not strengthening the correct muscles.  Lie on your side so that your head, shoulders, knee and hip line up. You may bend your lower knee to help maintain your balance. Your right / left leg should be on top.  Roll your hips slightly forward, so that your hips are stacked directly over each other and your right / left knee is facing forward.  Lift your top leg up 4-6 inches, leading with your heel. Be sure that your foot does not drift forward or that your knee does not roll toward the ceiling.  Hold this position for __________ seconds. You should feel the muscles in your outer hip lifting (you may not notice this until your leg begins to tire).  Slowly lower your leg to the starting position.  Allow the muscles to fully relax before beginning the next repetition. Repeat __________ times. Complete this exercise __________ times per day.  STRENGTH - Hip Adductors, Straight Leg Raises   Lie on your side so that your head, shoulders, knee and hip line up. You may place your upper foot in front to help maintain your balance. Your right / left leg should be on the bottom.  Roll your hips slightly forward, so that your hips are stacked directly over each other and your right / left knee is facing forward.  Tense the muscles in your inner thigh and lift your bottom leg 4-6 inches. Hold this position for __________ seconds.  Slowly lower your leg to the starting position. Allow the muscles to fully relax before beginning the next repetition. Repeat __________ times. Complete this exercise __________ times per day.  STRENGTH - Quadriceps, Straight Leg Raises  Quality counts! Watch for signs that the quadriceps muscle is working to insure you are strengthening the correct muscles and not "cheating" by substituting with healthier muscles.  Lay on your back with your right / left leg extended and your opposite knee bent.  Tense the muscles in the front of your right /   left thigh. You should see either your knee cap slide up or increased dimpling just above the knee. Your thigh may even quiver.  Tighten these muscles even more and raise your leg 4 to 6 inches off the floor. Hold for right / left seconds.  Keeping these muscles tense, lower your leg.  Relax the muscles slowly and completely in between each repetition. Repeat __________ times. Complete this exercise __________ times per day.  STRENGTH - Hip Abductors, Standing  Tie one end of a rubber exercise band/tubing to a secure surface (table, pole) and tie a loop at the other end.  Place the loop around your right / left ankle. Keeping your ankle with the band directly opposite of the secured end, step away until there is tension in  the tube/band.  Hold onto a chair as needed for balance.  Keeping your back upright, your shoulders over your hips, and your toes pointing forward, lift your right / left leg out to your side. Be sure to lift your leg with your hip muscles. Do not "throw" your leg or tip your body to lift your leg.  Slowly and with control, return to the starting position. Repeat exercise __________ times. Complete this exercise __________ times per day.  STRENGTH - Quadriceps, Squats  Stand in a door frame so that your feet and knees are in line with the frame.  Use your hands for balance, not support, on the frame.  Slowly lower your weight, bending at the hips and knees. Keep your lower legs upright so that they are parallel with the door frame. Squat only within the range that does not increase your knee pain. Never let your hips drop below your knees.  Slowly return upright, pushing with your legs, not pulling with your hands.   This information is not intended to replace advice given to you by your health care provider. Make sure you discuss any questions you have with your health care provider.   Document Released: 07/11/2005 Document Revised: 07/14/2014 Document Reviewed: 10/05/2008 Elsevier Interactive Patient Education 2016 [employer]  

## 2015-05-05 LAB — HEPATITIS C ANTIBODY: HCV Ab: NEGATIVE

## 2015-05-28 ENCOUNTER — Ambulatory Visit
Admission: RE | Admit: 2015-05-28 | Discharge: 2015-05-28 | Disposition: A | Discharge status: Home / Self Care | Payer: BLUE CROSS/BLUE SHIELD | Source: Ambulatory Visit | Attending: Family Medicine | Admitting: Family Medicine

## 2015-05-28 DIAGNOSIS — E2839 Other primary ovarian failure: Secondary | ICD-10-CM | POA: Diagnosis present

## 2015-05-28 DIAGNOSIS — M8580 Other specified disorders of bone density and structure, unspecified site: Secondary | ICD-10-CM | POA: Diagnosis not present

## 2015-05-28 DIAGNOSIS — Z1231 Encounter for screening mammogram for malignant neoplasm of breast: Secondary | ICD-10-CM

## 2015-05-29 ENCOUNTER — Other Ambulatory Visit: Payer: Self-pay | Admitting: Family Medicine

## 2015-05-29 DIAGNOSIS — R928 Other abnormal and inconclusive findings on diagnostic imaging of breast: Secondary | ICD-10-CM

## 2015-06-04 ENCOUNTER — Encounter: Payer: Self-pay | Admitting: Family Medicine

## 2015-06-07 NOTE — Telephone Encounter (Signed)
Spoke with patient regarding [employer] Message. Pt received results of mammogram on Friday, 11/25.  Radiology did not contact patient until Wednesday, 11/30 with abnormal results.  Scheduled for further imaging 06/11/15. Patient coping well and "preparing for the worst and hoping for the best".

## 2015-06-11 ENCOUNTER — Ambulatory Visit
Admission: RE | Admit: 2015-06-11 | Discharge: 2015-06-11 | Disposition: A | Discharge status: Home / Self Care | Payer: BLUE CROSS/BLUE SHIELD | Source: Ambulatory Visit | Attending: Family Medicine | Admitting: Family Medicine

## 2015-06-11 DIAGNOSIS — N63 Unspecified lump in breast: Secondary | ICD-10-CM | POA: Diagnosis not present

## 2015-06-11 DIAGNOSIS — R928 Other abnormal and inconclusive findings on diagnostic imaging of breast: Secondary | ICD-10-CM | POA: Diagnosis present

## 2015-06-13 ENCOUNTER — Encounter: Payer: Self-pay | Admitting: Family Medicine

## 2015-06-15 ENCOUNTER — Other Ambulatory Visit: Payer: Self-pay

## 2015-06-15 MED ORDER — ESOMEPRAZOLE MAGNESIUM 40 MG PO CPDR: DELAYED_RELEASE_CAPSULE | ORAL | Status: DC

## 2015-06-18 ENCOUNTER — Other Ambulatory Visit: Payer: Self-pay

## 2015-07-02 ENCOUNTER — Encounter: Payer: BLUE CROSS/BLUE SHIELD | Admitting: Family Medicine

## 2015-08-06 ENCOUNTER — Encounter: Payer: BLUE CROSS/BLUE SHIELD | Admitting: Family Medicine

## 2015-10-24 ENCOUNTER — Ambulatory Visit (INDEPENDENT_AMBULATORY_CARE_PROVIDER_SITE_OTHER): Payer: BLUE CROSS/BLUE SHIELD | Admitting: Family Medicine

## 2015-10-24 ENCOUNTER — Ambulatory Visit (INDEPENDENT_AMBULATORY_CARE_PROVIDER_SITE_OTHER): Payer: BLUE CROSS/BLUE SHIELD

## 2015-10-24 VITALS — BP 126/82 | HR 76 | Temp 98.4°F | Resp 16 | Ht 62.0 in | Wt 163.8 lb

## 2015-10-24 DIAGNOSIS — M5416 Radiculopathy, lumbar region: Secondary | ICD-10-CM

## 2015-10-24 DIAGNOSIS — M5136 Other intervertebral disc degeneration, lumbar region: Secondary | ICD-10-CM

## 2015-10-24 MED ORDER — GABAPENTIN 100 MG PO CAPS: 100.0000 mg | ORAL_CAPSULE | Freq: Three times a day (TID) | ORAL | Status: DC

## 2015-10-24 NOTE — Progress Notes (Signed)
Subjective:    Patient ID: Heather Ortiz, female    DOB: 1946-04-17, 70 y.o.   MRN: [id number]  10/24/2015  Leg Pain   HPI This 70 y.o. female presents for evaluation of L leg burning and pain.  No joint pain; having pain in muscle; from hip to top of foot; burning sensation; awakens at night.  No change in activity level.  Onset several months ago; worsening in last week.  Onset six months ago.  Has leg cramps but this is very different.  Wakes patient up past five nights; cannot get comfortable and cannot get back to sleep. No pain with laying on L hip.  History of sciatica on R; more down back of leg.  Entire leg hurts; burning is in lower part of leg; L leg will swell intermittent.  Swelling is separate issue.  Has taken Aleve qhs; cardiology recommended against Ibuprofen.  Tylenol does not touch pain.  Aleve decreased pain but did not alleviate it.  Normal b/b function.  No saddle paresthesias.  Does not tolerate prednisone; causes palpitations and nervousness.  Tolerable during the day; intermittent; nighttime pain is constant; severity 8/10.  Previous Neurontin use 15-20 years ago for shoulder pain; tolerated it well.     Review of Systems  Constitutional: Negative for fever, chills, diaphoresis and fatigue.  Eyes: Negative for visual disturbance.  Respiratory: Negative for cough and shortness of breath.   Cardiovascular: Negative for chest pain, palpitations and leg swelling.  Gastrointestinal: Negative for nausea, vomiting, abdominal pain, diarrhea and constipation.  Endocrine: Negative for cold intolerance, heat intolerance, polydipsia, polyphagia and polyuria.  Genitourinary: Negative for decreased urine volume and difficulty urinating.  Musculoskeletal: Positive for myalgias, back pain and arthralgias.  Neurological: Positive for numbness. Negative for dizziness, tremors, seizures, syncope, facial asymmetry, speech difficulty, weakness, light-headedness and headaches.    Past  Medical History  Diagnosis Date  . Pure hypercholesterolemia   . GERD (gastroesophageal reflux disease)   . Allergic rhinitis, cause unspecified   . Unspecified essential hypertension   . Symptomatic menopausal or female climacteric states   . Unspecified gastritis and gastroduodenitis without mention of hemorrhage   . Esophagitis, unspecified   . Diverticulosis of colon (without mention of hemorrhage)   . External hemorrhoids without mention of complication   . Osteopenia    Past Surgical History  Procedure Laterality Date  . Tubal ligation    . Cardiac catheterization  2006    Normal coronary arteries. [place]  . Cholecystectomy  06/2005  . Colonoscopy  06/06/2012    normal. [place].  Repeat 5 years.   Allergies  Allergen Reactions  . Prednisone Palpitations  . Bee Venom     Bee Stings  . Crestor [Rosuvastatin]     Joint pain  . Influenza Vaccines     Arm Swelling  . Levaquin [Levofloxacin In D5w]     Joint pain , jittery  . Lipitor [Atorvastatin]     Joint pain  . Mobic [Meloxicam]     Flush face   . Penicillins Hives  . Pravastatin     Joint pain  . Simvastatin     Joint pain  . Sulfa Drugs Cross Reactors Hives  . Zithromax [Azithromycin] Hives  . Ceftin [Cefuroxime Axetil] Rash  . Doxycycline Rash    Social History   Social History  . Marital Status: Married    Spouse Name: N/A  . Number of Children: 3  . Years of Education: 12   Occupational History  .  retired     Education officer, [employer]   Social History Main Topics  . Smoking status: Never Smoker   . Smokeless tobacco: Not on file  . Alcohol Use: Yes     Comment: once weekly  . Drug Use: No  . Sexual Activity: Yes   Other Topics Concern  . Not on file   Social History Narrative   Always uses seat belts.    Married x 46 years; happily; no abuse.       Children:  3 children and 8 grandchildren.      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        Tobacco; never      Alcohol:  rare      Drugs: none     Caffeine Use: Coffee, tea, 3 servings / day.       Exercise: Moderate, 4 x weeg walking; Recently joined a gym.      Guns: none   Family History  Problem Relation Age of Onset  . Hypertension Mother   . Heart disease Mother     CAD/valve replacement/CABG  . Stroke Mother 57    TIA's multiple  . Cancer Mother     bladder  . Glaucoma Mother   . Aortic aneurysm Mother   . Emphysema Father   . Glaucoma Father     also Mother  . COPD Father   . Hyperlipidemia Brother   . Meniere's disease Brother   . Aortic aneurysm    . Hypertension Sister   . Breast cancer Neg Hx        Objective:    BP 126/82 mmHg  Pulse 76  Temp(Src) 98.4 F (36.9 C) (Oral)  Resp 16  Ht  (1.575 m)  Wt 163 lb 12.8 oz (74.299 kg)  BMI 29.95 kg/m2  SpO2 97% Physical Exam  Constitutional: She is oriented to person, place, and time. She appears well-developed and well-nourished. No distress.  HENT:  Head: Normocephalic and atraumatic.  Right Ear: External ear normal.  Left Ear: External ear normal.  Nose: Nose normal.  Mouth/Throat: Oropharynx is clear and moist.  Eyes: Conjunctivae and EOM are normal. Pupils are equal, round, and reactive to light.  Neck: Normal range of motion. Neck supple. Carotid bruit is not present. No thyromegaly present.  Cardiovascular: Normal rate, regular rhythm, normal heart sounds and intact distal pulses.  Exam reveals no gallop and no friction rub.   No murmur heard. Pulmonary/Chest: Effort normal and breath sounds normal. She has no wheezes. She has no rales.  Abdominal: Soft. Bowel sounds are normal. She exhibits no distension and no mass. There is no tenderness. There is no rebound and no guarding.  Musculoskeletal:       Left hip: Normal. She exhibits normal range of motion, normal strength, no tenderness, no bony tenderness, no crepitus and no deformity.       Lumbar back: She exhibits normal range of motion, no tenderness, no bony  tenderness, no pain, no spasm and normal pulse.  Lumbar spine:  Non-tender midline; non-tender paraspinal regions B.  Straight leg raises negative B; toe and heel walking intact; marching intact; motor 5/5 BLE.  Full ROM lumbar spine without limitation.   Lymphadenopathy:    She has no cervical adenopathy.  Neurological: She is alert and oriented to person, place, and time. No cranial nerve deficit.  Skin: Skin is warm and dry. No rash noted. She is not diaphoretic. No erythema. No pallor.  Psychiatric: She  has a normal mood and affect. Her behavior is normal.   Dg Lumbar Spine Complete  10/24/2015  CLINICAL DATA:  [age] year old female with left side lumbar radicular pain and burning. Initial encounter. EXAM: LUMBAR SPINE - COMPLETE 4+ VIEW COMPARISON:  Lumbar radiographs 01/15/2012. FINDINGS: Cholecystectomy and tubal ligation clips re- demonstrated. Normal lumbar segmentation. Stable lumbar vertebral height and alignment. Mild lumbar degenerative endplate spurring, maximal at L1-L2 and L2-L3. Mild lower lumbar facet hypertrophy. No evidence of pars fracture or spondylolisthesis. Visible lower thoracic levels appear intact. Questionable fusion of the right SI joint, new since 2013. IMPRESSION: 1. No acute osseous abnormality in the thoracic spine. Mild endplate degeneration at L2-L3, L3-L4. Mild facet degeneration at L4-L5 and L5-S1. 2. Questionable ankylosis of the right SI joint since 2013. Query previous right sacroiliitis or chronic right sacral pain. Electronically Signed   By: Odessa FlemingH  Hall M.D.   On: 10/24/2015 16:25        Assessment & Plan:   1. Left lumbar radiculopathy   2. Degenerative disc disease, lumbar    -New. -Home exercise program provided; if no improvement in 4 weeks, refer for PT and ortho referral. -rx for Neurontin provided.   Orders Placed This Encounter  Procedures  . DG Lumbar Spine Complete    Standing Status: Future     Number of Occurrences: 1     Standing  Expiration Date: 10/23/2016    Order Specific Question:  Reason for Exam (SYMPTOM  OR DIAGNOSIS REQUIRED)    Answer:  L radicular pain with burning    Order Specific Question:  Preferred imaging location?    Answer:  External   Meds ordered this encounter  Medications  . amLODipine (NORVASC) 5 MG tablet    Sig: Take 5 mg by mouth daily.  Marland Kitchen. gabapentin (NEURONTIN) 100 MG capsule    Sig: Take 1-3 capsules (100-300 mg total) by mouth 3 (three) times daily.    Dispense:  90 capsule    Refill:  3    No Follow-up on file.    Ireland Virrueta Paulita FujitaMartin Kento Gossman, M.D. [facility]  [place] [address] [place], [place]  [id number] [phone] phone [phone] fax

## 2015-10-24 NOTE — Patient Instructions (Addendum)
1.  Take Aleve twice daily. 2. Take Gabapentin with dinner. 3.  Perform exercises daily.     IF you received an x-ray today, you will receive an invoice from [facility]. Please contact [facility] at [phone] with questions or concerns regarding your invoice.   IF you received labwork today, you will receive an invoice from [employer]. Please contact [facility] at [phone] with questions or concerns regarding your invoice.   Our billing staff will not be able to assist you with questions regarding bills from these companies.  You will be contacted with the lab results as soon as they are available. The fastest way to get your results is to activate your My Chart account. Instructions are located on the last page of this paperwork. If you have not heard from [place] regarding the results in 2 weeks, please contact this office.      Sciatica With Rehab The sciatic nerve runs from the back down the leg and is responsible for sensation and control of the muscles in the back (posterior) side of the thigh, lower leg, and foot. Sciatica is a condition that is characterized by inflammation of this nerve.  SYMPTOMS   Signs of nerve damage, including numbness and/or weakness along the posterior side of the lower extremity.  Pain in the back of the thigh that may also travel down the leg.  Pain that worsens when sitting for long periods of time.  Occasionally, pain in the back or buttock. CAUSES  Inflammation of the sciatic nerve is the cause of sciatica. The inflammation is due to something irritating the nerve. Common sources of irritation include:  Sitting for long periods of time.  Direct trauma to the nerve.  Arthritis of the spine.  Herniated or ruptured disk.  Slipping of the vertebrae (spondylolisthesis).  Pressure from soft tissues, such as muscles or ligament-like tissue (fascia). RISK INCREASES WITH:  Sports that place pressure  or stress on the spine (football or weightlifting).  Poor strength and flexibility.  Failure to warm up properly before activity.  Family history of low back pain or disk disorders.  Previous back injury or surgery.  Poor body mechanics, especially when lifting, or poor posture. PREVENTION   Warm up and stretch properly before activity.  Maintain physical fitness:  Strength, flexibility, and endurance.  Cardiovascular fitness.  Learn and use proper technique, especially with posture and lifting. When possible, have coach correct improper technique.  Avoid activities that place stress on the spine. PROGNOSIS If treated properly, then sciatica usually resolves within 6 weeks. However, occasionally surgery is necessary.  RELATED COMPLICATIONS   Permanent nerve damage, including pain, numbness, tingle, or weakness.  Chronic back pain.  Risks of surgery: infection, bleeding, nerve damage, or damage to surrounding tissues. TREATMENT Treatment initially involves resting from any activities that aggravate your symptoms. The use of ice and medication may help reduce pain and inflammation. The use of strengthening and stretching exercises may help reduce pain with activity. These exercises may be performed at home or with referral to a therapist. A therapist may recommend further treatments, such as transcutaneous electronic nerve stimulation (TENS) or ultrasound. Your caregiver may recommend corticosteroid injections to help reduce inflammation of the sciatic nerve. If symptoms persist despite non-surgical (conservative) treatment, then surgery may be recommended. MEDICATION  If pain medication is necessary, then nonsteroidal anti-inflammatory medications, such as aspirin and ibuprofen, or other minor pain relievers, such as acetaminophen, are often recommended.  Do not take pain medication  for 7 days before surgery.  Prescription pain relievers may be given if deemed necessary by  your caregiver. Use only as directed and only as much as you need.  Ointments applied to the skin may be helpful.  Corticosteroid injections may be given by your caregiver. These injections should be reserved for the most serious cases, because they may only be given a certain number of times. HEAT AND COLD  Cold treatment (icing) relieves pain and reduces inflammation. Cold treatment should be applied for 10 to 15 minutes every 2 to 3 hours for inflammation and pain and immediately after any activity that aggravates your symptoms. Use ice packs or massage the area with a piece of ice (ice massage).  Heat treatment may be used prior to performing the stretching and strengthening activities prescribed by your caregiver, physical therapist, or athletic trainer. Use a heat pack or soak the injury in warm water. SEEK MEDICAL CARE IF:  Treatment seems to offer no benefit, or the condition worsens.  Any medications produce adverse side effects. EXERCISES  RANGE OF MOTION (ROM) AND STRETCHING EXERCISES - Sciatica Most people with sciatic will find that their symptoms worsen with either excessive bending forward (flexion) or arching at the low back (extension). The exercises which will help resolve your symptoms will focus on the opposite motion. Your physician, physical therapist or athletic trainer will help you determine which exercises will be most helpful to resolve your low back pain. Do not complete any exercises without first consulting with your clinician. Discontinue any exercises which worsen your symptoms until you speak to your clinician. If you have pain, numbness or tingling which travels down into your buttocks, leg or foot, the goal of the therapy is for these symptoms to move closer to your back and eventually resolve. Occasionally, these leg symptoms will get better, but your low back pain may worsen; this is typically an indication of progress in your rehabilitation. Be certain to be  very alert to any changes in your symptoms and the activities in which you participated in the 24 hours prior to the change. Sharing this information with your clinician will allow him/her to most efficiently treat your condition. These exercises may help you when beginning to rehabilitate your injury. Your symptoms may resolve with or without further involvement from your physician, physical therapist or athletic trainer. While completing these exercises, remember:   Restoring tissue flexibility helps normal motion to return to the joints. This allows healthier, less painful movement and activity.  An effective stretch should be held for at least 30 seconds.  A stretch should never be painful. You should only feel a gentle lengthening or release in the stretched tissue. FLEXION RANGE OF MOTION AND STRETCHING EXERCISES: STRETCH - Flexion, Single Knee to Chest   Lie on a firm bed or floor with both legs extended in front of you.  Keeping one leg in contact with the floor, bring your opposite knee to your chest. Hold your leg in place by either grabbing behind your thigh or at your knee.  Pull until you feel a gentle stretch in your low back. Hold __________ seconds.  Slowly release your grasp and repeat the exercise with the opposite side. Repeat __________ times. Complete this exercise __________ times per day.  STRETCH - Flexion, Double Knee to Chest  Lie on a firm bed or floor with both legs extended in front of you.  Keeping one leg in contact with the floor, bring your opposite knee to your  chest.  Tense your stomach muscles to support your back and then lift your other knee to your chest. Hold your legs in place by either grabbing behind your thighs or at your knees.  Pull both knees toward your chest until you feel a gentle stretch in your low back. Hold __________ seconds.  Tense your stomach muscles and slowly return one leg at a time to the floor. Repeat __________ times.  Complete this exercise __________ times per day.  STRETCH - Low Trunk Rotation   Lie on a firm bed or floor. Keeping your legs in front of you, bend your knees so they are both pointed toward the ceiling and your feet are flat on the floor.  Extend your arms out to the side. This will stabilize your upper body by keeping your shoulders in contact with the floor.  Gently and slowly drop both knees together to one side until you feel a gentle stretch in your low back. Hold for __________ seconds.  Tense your stomach muscles to support your low back as you bring your knees back to the starting position. Repeat the exercise to the other side. Repeat __________ times. Complete this exercise __________ times per day  EXTENSION RANGE OF MOTION AND FLEXIBILITY EXERCISES: STRETCH - Extension, Prone on Elbows  Lie on your stomach on the floor, a bed will be too soft. Place your palms about shoulder width apart and at the height of your head.  Place your elbows under your shoulders. If this is too painful, stack pillows under your chest.  Allow your body to relax so that your hips drop lower and make contact more completely with the floor.  Hold this position for __________ seconds.  Slowly return to lying flat on the floor. Repeat __________ times. Complete this exercise __________ times per day.  RANGE OF MOTION - Extension, Prone Press Ups  Lie on your stomach on the floor, a bed will be too soft. Place your palms about shoulder width apart and at the height of your head.  Keeping your back as relaxed as possible, slowly straighten your elbows while keeping your hips on the floor. You may adjust the placement of your hands to maximize your comfort. As you gain motion, your hands will come more underneath your shoulders.  Hold this position __________ seconds.  Slowly return to lying flat on the floor. Repeat __________ times. Complete this exercise __________ times per day.  STRENGTHENING  EXERCISES - Sciatica  These exercises may help you when beginning to rehabilitate your injury. These exercises should be done near your "sweet spot." This is the neutral, low-back arch, somewhere between fully rounded and fully arched, that is your least painful position. When performed in this safe range of motion, these exercises can be used for people who have either a flexion or extension based injury. These exercises may resolve your symptoms with or without further involvement from your physician, physical therapist or athletic trainer. While completing these exercises, remember:   Muscles can gain both the endurance and the strength needed for everyday activities through controlled exercises.  Complete these exercises as instructed by your physician, physical therapist or athletic trainer. Progress with the resistance and repetition exercises only as your caregiver advises.  You may experience muscle soreness or fatigue, but the pain or discomfort you are trying to eliminate should never worsen during these exercises. If this pain does worsen, stop and make certain you are following the directions exactly. If the pain is still present  after adjustments, discontinue the exercise until you can discuss the trouble with your clinician. STRENGTHENING - Deep Abdominals, Pelvic Tilt   Lie on a firm bed or floor. Keeping your legs in front of you, bend your knees so they are both pointed toward the ceiling and your feet are flat on the floor.  Tense your lower abdominal muscles to press your low back into the floor. This motion will rotate your pelvis so that your tail bone is scooping upwards rather than pointing at your feet or into the floor.  With a gentle tension and even breathing, hold this position for __________ seconds. Repeat __________ times. Complete this exercise __________ times per day.  STRENGTHENING - Abdominals, Crunches   Lie on a firm bed or floor. Keeping your legs in front of  you, bend your knees so they are both pointed toward the ceiling and your feet are flat on the floor. Cross your arms over your chest.  Slightly tip your chin down without bending your neck.  Tense your abdominals and slowly lift your trunk high enough to just clear your shoulder blades. Lifting higher can put excessive stress on the low back and does not further strengthen your abdominal muscles.  Control your return to the starting position. Repeat __________ times. Complete this exercise __________ times per day.  STRENGTHENING - Quadruped, Opposite UE/LE Lift  Assume a hands and knees position on a firm surface. Keep your hands under your shoulders and your knees under your hips. You may place padding under your knees for comfort.  Find your neutral spine and gently tense your abdominal muscles so that you can maintain this position. Your shoulders and hips should form a rectangle that is parallel with the floor and is not twisted.  Keeping your trunk steady, lift your right hand no higher than your shoulder and then your left leg no higher than your hip. Make sure you are not holding your breath. Hold this position __________ seconds.  Continuing to keep your abdominal muscles tense and your back steady, slowly return to your starting position. Repeat with the opposite arm and leg. Repeat __________ times. Complete this exercise __________ times per day.  STRENGTHENING - Abdominals and Quadriceps, Straight Leg Raise   Lie on a firm bed or floor with both legs extended in front of you.  Keeping one leg in contact with the floor, bend the other knee so that your foot can rest flat on the floor.  Find your neutral spine, and tense your abdominal muscles to maintain your spinal position throughout the exercise.  Slowly lift your straight leg off the floor about 6 inches for a count of 15, making sure to not hold your breath.  Still keeping your neutral spine, slowly lower your leg all  the way to the floor. Repeat this exercise with each leg __________ times. Complete this exercise __________ times per day. POSTURE AND BODY MECHANICS CONSIDERATIONS - Sciatica Keeping correct posture when sitting, standing or completing your activities will reduce the stress put on different body tissues, allowing injured tissues a chance to heal and limiting painful experiences. The following are general guidelines for improved posture. Your physician or physical therapist will provide you with any instructions specific to your needs. While reading these guidelines, remember:  The exercises prescribed by your provider will help you have the flexibility and strength to maintain correct postures.  The correct posture provides the optimal environment for your joints to work. All of your joints have less  wear and tear when properly supported by a spine with good posture. This means you will experience a healthier, less painful body.  Correct posture must be practiced with all of your activities, especially prolonged sitting and standing. Correct posture is as important when doing repetitive low-stress activities (typing) as it is when doing a single heavy-load activity (lifting). RESTING POSITIONS Consider which positions are most painful for you when choosing a resting position. If you have pain with flexion-based activities (sitting, bending, stooping, squatting), choose a position that allows you to rest in a less flexed posture. You would want to avoid curling into a fetal position on your side. If your pain worsens with extension-based activities (prolonged standing, working overhead), avoid resting in an extended position such as sleeping on your stomach. Most people will find more comfort when they rest with their spine in a more neutral position, neither too rounded nor too arched. Lying on a non-sagging bed on your side with a pillow between your knees, or on your back with a pillow under your  knees will often provide some relief. Keep in mind, being in any one position for a prolonged period of time, no matter how correct your posture, can still lead to stiffness. PROPER SITTING POSTURE In order to minimize stress and discomfort on your spine, you must sit with correct posture Sitting with good posture should be effortless for a healthy body. Returning to good posture is a gradual process. Many people can work toward this most comfortably by using various supports until they have the flexibility and strength to maintain this posture on their own. When sitting with proper posture, your ears will fall over your shoulders and your shoulders will fall over your hips. You should use the back of the chair to support your upper back. Your low back will be in a neutral position, just slightly arched. You may place a small pillow or folded towel at the base of your low back for support.  When working at a desk, create an environment that supports good, upright posture. Without extra support, muscles fatigue and lead to excessive strain on joints and other tissues. Keep these recommendations in mind: CHAIR:   A chair should be able to slide under your desk when your back makes contact with the back of the chair. This allows you to work closely.  The chair's height should allow your eyes to be level with the upper part of your monitor and your hands to be slightly lower than your elbows. BODY POSITION  Your feet should make contact with the floor. If this is not possible, use a foot rest.  Keep your ears over your shoulders. This will reduce stress on your neck and low back. INCORRECT SITTING POSTURES   If you are feeling tired and unable to assume a healthy sitting posture, do not slouch or slump. This puts excessive strain on your back tissues, causing more damage and pain. Healthier options include:  Using more support, like a lumbar pillow.  Switching tasks to something that requires you to  be upright or walking.  Talking a brief walk.  Lying down to rest in a neutral-spine position. PROLONGED STANDING WHILE SLIGHTLY LEANING FORWARD  When completing a task that requires you to lean forward while standing in one place for a long time, place either foot up on a stationary 2-4 inch high object to help maintain the best posture. When both feet are on the ground, the low back tends to lose its  slight inward curve. If this curve flattens (or becomes too large), then the back and your other joints will experience too much stress, fatigue more quickly and can cause pain.  CORRECT STANDING POSTURES Proper standing posture should be assumed with all daily activities, even if they only take a few moments, like when brushing your teeth. As in sitting, your ears should fall over your shoulders and your shoulders should fall over your hips. You should keep a slight tension in your abdominal muscles to brace your spine. Your tailbone should point down to the ground, not behind your body, resulting in an over-extended swayback posture.  INCORRECT STANDING POSTURES  Common incorrect standing postures include a forward head, locked knees and/or an excessive swayback. WALKING Walk with an upright posture. Your ears, shoulders and hips should all line-up. PROLONGED ACTIVITY IN A FLEXED POSITION When completing a task that requires you to bend forward at your waist or lean over a low surface, try to find a way to stabilize 3 of 4 of your limbs. You can place a hand or elbow on your thigh or rest a knee on the surface you are reaching across. This will provide you more stability so that your muscles do not fatigue as quickly. By keeping your knees relaxed, or slightly bent, you will also reduce stress across your low back. CORRECT LIFTING TECHNIQUES DO :   Assume a wide stance. This will provide you more stability and the opportunity to get as close as possible to the object which you are lifting.  Tense  your abdominals to brace your spine; then bend at the knees and hips. Keeping your back locked in a neutral-spine position, lift using your leg muscles. Lift with your legs, keeping your back straight.  Test the weight of unknown objects before attempting to lift them.  Try to keep your elbows locked down at your sides in order get the best strength from your shoulders when carrying an object.  Always ask for help when lifting heavy or awkward objects. INCORRECT LIFTING TECHNIQUES DO NOT:   Lock your knees when lifting, even if it is a small object.  Bend and twist. Pivot at your feet or move your feet when needing to change directions.  Assume that you cannot safely pick up a paperclip without proper posture.   This information is not intended to replace advice given to you by your health care provider. Make sure you discuss any questions you have with your health care provider.   Document Released: 06/23/2005 Document Revised: 11/07/2014 Document Reviewed: 10/05/2008 [employer] Interactive Patient Education [employer]

## 2015-11-06 ENCOUNTER — Other Ambulatory Visit: Payer: Self-pay | Admitting: Family Medicine

## 2015-11-06 ENCOUNTER — Ambulatory Visit (INDEPENDENT_AMBULATORY_CARE_PROVIDER_SITE_OTHER): Payer: BLUE CROSS/BLUE SHIELD | Admitting: Family Medicine

## 2015-11-06 ENCOUNTER — Encounter: Payer: Self-pay | Admitting: Family Medicine

## 2015-11-06 VITALS — BP 120/82 | HR 78 | Temp 98.1°F | Resp 16 | Ht 61.5 in | Wt 165.5 lb

## 2015-11-06 DIAGNOSIS — I10 Essential (primary) hypertension: Secondary | ICD-10-CM

## 2015-11-06 DIAGNOSIS — R2243 Localized swelling, mass and lump, lower limb, bilateral: Secondary | ICD-10-CM

## 2015-11-06 DIAGNOSIS — J302 Other seasonal allergic rhinitis: Secondary | ICD-10-CM | POA: Diagnosis not present

## 2015-11-06 DIAGNOSIS — E7800 Pure hypercholesterolemia, unspecified: Secondary | ICD-10-CM | POA: Diagnosis not present

## 2015-11-06 DIAGNOSIS — Z0000 Encounter for general adult medical examination without abnormal findings: Secondary | ICD-10-CM | POA: Diagnosis not present

## 2015-11-06 DIAGNOSIS — R946 Abnormal results of thyroid function studies: Secondary | ICD-10-CM

## 2015-11-06 DIAGNOSIS — K219 Gastro-esophageal reflux disease without esophagitis: Secondary | ICD-10-CM | POA: Diagnosis not present

## 2015-11-06 DIAGNOSIS — N952 Postmenopausal atrophic vaginitis: Secondary | ICD-10-CM

## 2015-11-06 LAB — POCT URINALYSIS DIP (MANUAL ENTRY)
Bilirubin, UA: NEGATIVE
Blood, UA: NEGATIVE
GLUCOSE UA: NEGATIVE
Ketones, POC UA: NEGATIVE
Leukocytes, UA: NEGATIVE
NITRITE UA: NEGATIVE
Protein Ur, POC: NEGATIVE
Spec Grav, UA: 1.02
UROBILINOGEN UA: 0.2
pH, UA: 6

## 2015-11-06 LAB — COMPREHENSIVE METABOLIC PANEL
ALK PHOS: 70 U/L (ref 33–130)
ALT: 19 U/L (ref 6–29)
AST: 23 U/L (ref 10–35)
Albumin: 4.3 g/dL (ref 3.6–5.1)
BILIRUBIN TOTAL: 0.6 mg/dL (ref 0.2–1.2)
BUN: 20 mg/dL (ref 7–25)
CALCIUM: 9.9 mg/dL (ref 8.6–10.4)
CO2: 25 mmol/L (ref 20–31)
Chloride: 104 mmol/L (ref 98–110)
Creat: 0.83 mg/dL (ref 0.50–0.99)
GLUCOSE: 87 mg/dL (ref 65–99)
Potassium: 4 mmol/L (ref 3.5–5.3)
Sodium: 142 mmol/L (ref 135–146)
Total Protein: 7.3 g/dL (ref 6.1–8.1)

## 2015-11-06 LAB — CBC WITH DIFFERENTIAL/PLATELET
BASOS PCT: 1 %
Basophils Absolute: 62 cells/uL (ref 0–200)
Eosinophils Absolute: 248 cells/uL (ref 15–500)
Eosinophils Relative: 4 %
HEMATOCRIT: 40 % (ref 35.0–45.0)
HEMOGLOBIN: 13.2 g/dL (ref 11.7–15.5)
LYMPHS ABS: 2480 {cells}/uL (ref 850–3900)
Lymphocytes Relative: 40 %
MCH: 29.9 pg (ref 27.0–33.0)
MCHC: 33 g/dL (ref 32.0–36.0)
MCV: 90.5 fL (ref 80.0–100.0)
MONO ABS: 372 {cells}/uL (ref 200–950)
MPV: 11.1 fL (ref 7.5–12.5)
Monocytes Relative: 6 %
Neutro Abs: 3038 cells/uL (ref 1500–7800)
Neutrophils Relative %: 49 %
PLATELETS: 242 10*3/uL (ref 140–400)
RBC: 4.42 MIL/uL (ref 3.80–5.10)
RDW: 13.5 % (ref 11.0–15.0)
WBC: 6.2 10*3/uL (ref 3.8–10.8)

## 2015-11-06 LAB — LIPID PANEL
Cholesterol: 288 mg/dL — ABNORMAL HIGH (ref 125–200)
HDL: 67 mg/dL (ref >=46)
LDL Cholesterol: 185 mg/dL — ABNORMAL HIGH (ref <130)
Total CHOL/HDL Ratio: 4.3 Ratio (ref <=5.0)
Triglycerides: 178 mg/dL — ABNORMAL HIGH (ref <150)
VLDL: 36 mg/dL — ABNORMAL HIGH (ref <30)

## 2015-11-06 LAB — TSH: TSH: 1.55 m[IU]/L

## 2015-11-06 MED ORDER — POTASSIUM CHLORIDE CRYS ER 20 MEQ PO TBCR: 20.0000 meq | EXTENDED_RELEASE_TABLET | Freq: Every day | ORAL | Status: DC

## 2015-11-06 MED ORDER — MONTELUKAST SODIUM 10 MG PO TABS: 10.0000 mg | ORAL_TABLET | Freq: Every day | ORAL | Status: DC

## 2015-11-06 MED ORDER — FUROSEMIDE 20 MG PO TABS: 20.0000 mg | ORAL_TABLET | Freq: Every day | ORAL | Status: DC

## 2015-11-06 MED ORDER — GABAPENTIN 100 MG PO CAPS: 100.0000 mg | ORAL_CAPSULE | Freq: Three times a day (TID) | ORAL | Status: DC

## 2015-11-06 MED ORDER — ESTRADIOL 0.1 MG/GM VA CREA: 1.0000 | TOPICAL_CREAM | VAGINAL | Status: DC

## 2015-11-06 NOTE — Progress Notes (Signed)
Subjective:    Patient ID: Heather Ortiz, female    DOB: 1946/03/21, 70 y.o.   MRN: [id number]  11/06/2015  Annual Exam and Medication Refill   HPI This 70 y.o. female presents for Annual Wellness Examination and follow-up of chronic medical conditions:   Last physical:  Pap smear: 01/04/2014 WNL HPV negative. Mammogram:  05/28/2015; needed additional views; recommended annual screening. Colonoscopy:  01-19-2007; repeat in ten years; diverticulosis; internal and external hemorrhoids.  [place]. Bone density:  05-28-2015 TDAP: Pneumovax:  2014; Prevnar 13 2015 Zostavax:  Never; scared to receive.   Influenza:  Allergic to influenza Eye exam:  Dental exam:  HTN: Patient reports good compliance with medication, good tolerance to medication, and good symptom control.  120s/80s; usually 130s/80s.  L foot stays swollen since last two months.  Has been on Amlodipine since May 2016.  Started with 2.5mg  and has increased to 5mg  daily.    Hyperlipidemia: intolerant to statins.  L sciatica: improved some; had an episode three days ago, hip knee and ankle all the way down with severe pain.  Horrible pain; unable to lay on R or L sides.  Had worked in the yard that day prior to worsening pain. Taking Neurontin 300mg  tid. Having some leg swelling and lid swelling.  Review of Systems  Constitutional: Negative for fever, chills, diaphoresis, activity change, appetite change, fatigue and unexpected weight change.  HENT: Negative for congestion, dental problem, drooling, ear discharge, ear pain, facial swelling, hearing loss, mouth sores, nosebleeds, postnasal drip, rhinorrhea, sinus pressure, sneezing, sore throat, tinnitus, trouble swallowing and voice change.   Eyes: Negative for photophobia, pain, discharge, redness, itching and visual disturbance.  Respiratory: Negative for apnea, cough, choking, chest tightness, shortness of breath, wheezing and stridor.   Cardiovascular: Negative for chest  pain, palpitations and leg swelling.  Gastrointestinal: Negative for nausea, vomiting, abdominal pain, diarrhea, constipation, blood in stool, abdominal distention, anal bleeding and rectal pain.  Endocrine: Negative for cold intolerance, heat intolerance, polydipsia, polyphagia and polyuria.  Genitourinary: Negative for dysuria, urgency, frequency, hematuria, flank pain, decreased urine volume, vaginal bleeding, vaginal discharge, enuresis, difficulty urinating, genital sores, vaginal pain, menstrual problem, pelvic pain and dyspareunia.  Musculoskeletal: Negative for myalgias, back pain, joint swelling, arthralgias, gait problem, neck pain and neck stiffness.  Skin: Negative for color change, pallor, rash and wound.  Allergic/Immunologic: Negative for environmental allergies, food allergies and immunocompromised state.  Neurological: Negative for dizziness, tremors, seizures, syncope, facial asymmetry, speech difficulty, weakness, light-headedness, numbness and headaches.  Hematological: Negative for adenopathy. Does not bruise/bleed easily.  Psychiatric/Behavioral: Negative for suicidal ideas, hallucinations, behavioral problems, confusion, sleep disturbance, self-injury, dysphoric mood, decreased concentration and agitation. The patient is not nervous/anxious and is not hyperactive.     Past Medical History  Diagnosis Date  . Pure hypercholesterolemia   . GERD (gastroesophageal reflux disease)   . Allergic rhinitis, cause unspecified   . Unspecified essential hypertension   . Symptomatic menopausal or female climacteric states   . Unspecified gastritis and gastroduodenitis without mention of hemorrhage   . Esophagitis, unspecified   . Diverticulosis of colon (without mention of hemorrhage)   . External hemorrhoids without mention of complication   . Osteopenia   . Allergy    Past Surgical History  Procedure Laterality Date  . Tubal ligation    . Cardiac catheterization  2006     Normal coronary arteries. [place]  . Cholecystectomy  06/2005  . Colonoscopy  06/06/2012    normal. [place].  Repeat 5 years.   Allergies  Allergen Reactions  . Prednisone Palpitations  . Bee Venom     Bee Stings  . Crestor [Rosuvastatin]     Joint pain  . Influenza Vaccines     Arm Swelling  . Levaquin [Levofloxacin In D5w]     Joint pain , jittery  . Lipitor [Atorvastatin]     Joint pain  . Mobic [Meloxicam]     Flush face   . Penicillins Hives  . Pravastatin     Joint pain  . Simvastatin     Joint pain  . Sulfa Drugs Cross Reactors Hives  . Zithromax [Azithromycin] Hives  . Ceftin [Cefuroxime Axetil] Rash  . Doxycycline Rash    Social History   Social History  . Marital Status: Married    Spouse Name: N/A  . Number of Children: 3  . Years of Education: 12   Occupational History  . retired     Education officer, community [employer]   Social History Main Topics  . Smoking status: Never Smoker   . Smokeless tobacco: Not on file  . Alcohol Use: Yes     Comment: once weekly  . Drug Use: No  . Sexual Activity: Yes   Other Topics Concern  . Not on file   Social History Narrative   Always uses seat belts.    Married x 46 years; happily; no abuse.       Children:  3 children and 8 grandchildren.      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        Tobacco; never      Alcohol: rare      Drugs: none     Caffeine Use: Coffee, tea, 3 servings / day.       Exercise: Moderate, 4 x weeg walking; Recently joined a gym.      Guns: none   Family History  Problem Relation Age of Onset  . Hypertension Mother   . Heart disease Mother     CAD/valve replacement/CABG  . Stroke Mother 82    TIA's multiple  . Cancer Mother     bladder  . Glaucoma Mother   . Aortic aneurysm Mother   . Emphysema Father   . Glaucoma Father     also Mother  . COPD Father   . Hyperlipidemia Brother   . Meniere's disease Brother   . Aortic aneurysm    . Hypertension Sister   . Breast  cancer Neg Hx        Objective:    BP 120/82 mmHg  Pulse 78  Temp(Src) 98.1 F (36.7 C) (Oral)  Resp 16  Ht 5' 1.5" (1.562 m)  Wt 165 lb 8 oz (75.07 kg)  BMI 30.77 kg/m2  SpO2 97% Physical Exam  Constitutional: Heather Ortiz is oriented to person, place, and time. Heather Ortiz appears well-developed and well-nourished. No distress.  HENT:  Head: Normocephalic and atraumatic.  Right Ear: External ear normal.  Left Ear: External ear normal.  Nose: Nose normal.  Mouth/Throat: Oropharynx is clear and moist.  Eyes: Conjunctivae and EOM are normal. Pupils are equal, round, and reactive to light.  Neck: Normal range of motion and full passive range of motion without pain. Neck supple. No JVD present. Carotid bruit is not present. No thyromegaly present.  Cardiovascular: Normal rate, regular rhythm and normal heart sounds.  Exam reveals no gallop and no friction rub.   No murmur heard. Pulmonary/Chest: Effort normal and breath sounds normal. Heather Ortiz has  no wheezes. Heather Ortiz has no rales.  Abdominal: Soft. Bowel sounds are normal. Heather Ortiz exhibits no distension and no mass. There is no tenderness. There is no rebound and no guarding.  Musculoskeletal:       Right shoulder: Normal.       Left shoulder: Normal.       Cervical back: Normal.  Lymphadenopathy:    Heather Ortiz has no cervical adenopathy.  Neurological: Heather Ortiz is alert and oriented to person, place, and time. Heather Ortiz has normal reflexes. No cranial nerve deficit. Heather Ortiz exhibits normal muscle tone. Coordination normal.  Skin: Skin is warm and dry. No rash noted. Heather Ortiz is not diaphoretic. No erythema. No pallor.  Psychiatric: Heather Ortiz has a normal mood and affect. Her behavior is normal. Judgment and thought content normal.  Nursing note and vitals reviewed.  Depression screen [facility] 2/9 11/06/2015 10/24/2015 05/04/2015 07/12/2014 01/04/2014  Decreased Interest 0 0 0 0 0  Down, Depressed, Hopeless 0 0 0 0 0  PHQ - 2 Score 0 0 0 0 0    Fall Risk  11/06/2015 10/24/2015 05/04/2015 07/12/2014  01/04/2014  Falls in the past year? No Yes No No Yes         Assessment & Plan:   1. Routine physical examination   2. Other seasonal allergic rhinitis   3. Pure hypercholesterolemia   4. Gastroesophageal reflux disease without esophagitis   5. Essential hypertension, benign   6. Thyroid function study abnormality   7. Atrophic vaginitis   8. Localized swelling of both lower legs     Orders Placed This Encounter  Procedures  . CBC with Differential/Platelet  . Comprehensive metabolic panel    Order Specific Question:  Has the patient fasted?    Answer:  Yes  . Lipid panel    Order Specific Question:  Has the patient fasted?    Answer:  Yes  . TSH  . POCT urinalysis dipstick   Meds ordered this encounter  Medications  . DISCONTD: potassium chloride SA (K-DUR,KLOR-CON) 20 MEQ tablet    Sig: Take 1 tablet (20 mEq total) by mouth daily.    Dispense:  90 tablet    Refill:  1  . DISCONTD: furosemide (LASIX) 20 MG tablet    Sig: Take 1 tablet (20 mg total) by mouth daily.    Dispense:  90 tablet    Refill:  1  . potassium chloride SA (K-DUR,KLOR-CON) 20 MEQ tablet    Sig: Take 1 tablet (20 mEq total) by mouth daily.    Dispense:  90 tablet    Refill:  1  . furosemide (LASIX) 20 MG tablet    Sig: Take 1 tablet (20 mg total) by mouth daily.    Dispense:  90 tablet    Refill:  1  . montelukast (SINGULAIR) 10 MG tablet    Sig: Take 1 tablet (10 mg total) by mouth at bedtime.    Dispense:  90 tablet    Refill:  1  . gabapentin (NEURONTIN) 100 MG capsule    Sig: Take 1-3 capsules (100-300 mg total) by mouth 3 (three) times daily.    Dispense:  540 capsule    Refill:  0  . estradiol (ESTRACE VAGINAL) 0.1 MG/GM vaginal cream    Sig: Place 1 Applicatorful vaginally 3 (three) times a week.    Dispense:  127.5 g    Refill:  3    Return in about 6 months (around 05/08/2016) for recheck blood pressure, cholesterol.    Nare Gaspari Daphine Deutscher  Katrinka Blazing, M.D. [facility] [address] [place], [place]  [address] [phone] phone [phone] fax

## 2015-11-06 NOTE — Patient Instructions (Addendum)
   IF you received an x-ray today, you will receive an invoice from [facility]. Please contact [facility] at [phone] with questions or concerns regarding your invoice.   IF you received labwork today, you will receive an invoice from [facility]. Please contact [facility] at [phone] with questions or concerns regarding your invoice.   Our billing staff will not be able to assist you with questions regarding bills from these companies.  You will be contacted with the lab results as soon as they are available. The fastest way to get your results is to activate your My Chart account. Instructions are located on the last page of this paperwork. If you have not heard from us regarding the results in 2 weeks, please contact this office.    Keeping You Healthy  Get These Tests  Blood Pressure- Have your blood pressure checked by your healthcare provider at least once a year.  Normal blood pressure is 120/80.  Weight- Have your body mass index (BMI) calculated to screen for obesity.  BMI is a measure of body fat based on height and weight.  You can calculate your own BMI at www.nhlbisupport.com/bmi/  Cholesterol- Have your cholesterol checked every year.  Diabetes- Have your blood sugar checked every year if you have high blood pressure, high cholesterol, a family history of diabetes or if you are overweight.  Pap Test - Have a pap test every 1 to 5 years if you have been sexually active.  If you are older than 65 and recent pap tests have been normal you may not need additional pap tests.  In addition, if you have had a hysterectomy  for benign disease additional pap tests are not necessary.  Mammogram-Yearly mammograms are essential for early detection of breast cancer  Screening for Colon Cancer- Colonoscopy starting at age 50. Screening may begin sooner depending on your family history and other health conditions.  Follow up colonoscopy  as directed by your Gastroenterologist.  Screening for Osteoporosis- Screening begins at age 65 with bone density scanning, sooner if you are at higher risk for developing Osteoporosis.  Get these medicines  Calcium with Vitamin D- Your body requires 1200-1500 mg of Calcium a day and 800-1000 IU of Vitamin D a day.  You can only absorb 500 mg of Calcium at a time therefore Calcium must be taken in 2 or 3 separate doses throughout the day.  Hormones- Hormone therapy has been associated with increased risk for certain cancers and heart disease.  Talk to your healthcare provider about if you need relief from menopausal symptoms.  Aspirin- Ask your healthcare provider about taking Aspirin to prevent Heart Disease and Stroke.  Get these Immuniztions  Flu shot- Every fall  Pneumonia shot- Once after the age of 65; if you are younger ask your healthcare provider if you need a pneumonia shot.  Tetanus- Every ten years.  Zostavax- Once after the age of 60 to prevent shingles.  Take these steps  Don't smoke- Your healthcare provider can help you quit. For tips on how to quit, ask your healthcare provider or go to www.smokefree.gov or call 1-800 QUIT-NOW.  Be physically active- Exercise 5 days a week for a minimum of 30 minutes.  If you are not already physically active, start slow and gradually work up to 30 minutes of moderate physical activity.  Try walking, dancing, bike riding, swimming, etc.  Eat a healthy diet- Eat a variety of healthy foods such as fruits, vegetables, whole   grains, low fat milk, low fat cheeses, yogurt, lean meats, chicken, fish, eggs, dried beans, tofu, etc.  For more information go to www.thenutritionsource.org  Dental visit- Brush and floss teeth twice daily; visit your dentist twice a year.  Eye exam- Visit your Optometrist or Ophthalmologist yearly.  Drink alcohol in moderation- Limit alcohol intake to one drink or less a day.  Never drink and  drive.  Depression- Your emotional health is as important as your physical health.  If you're feeling down or losing interest in things you normally enjoy, please talk to your healthcare provider.  Seat Belts- can save your life; always wear one  Smoke/Carbon Monoxide detectors- These detectors need to be installed on the appropriate level of your home.  Replace batteries at least once a year.  Violence- If anyone is threatening or hurting you, please tell your healthcare provider.  Living Will/ Health care power of attorney- Discuss with your healthcare provider and family. 

## 2015-11-19 ENCOUNTER — Encounter: Payer: Self-pay | Admitting: Family Medicine

## 2015-11-22 ENCOUNTER — Other Ambulatory Visit: Payer: Self-pay | Admitting: Family Medicine

## 2015-11-22 ENCOUNTER — Telehealth: Payer: Self-pay

## 2015-11-22 DIAGNOSIS — R600 Localized edema: Secondary | ICD-10-CM

## 2015-11-22 NOTE — Telephone Encounter (Signed)
Pt is scheduled for venous doppler at [facility] on 5/25 at noon.   Dr. Katrinka BlazingSmith, is this soon enough?

## 2015-11-26 NOTE — Telephone Encounter (Signed)
Yes, 11/29/15 at noon is fine; please advise patient if she has not been advised of appointment time.

## 2015-11-26 NOTE — Telephone Encounter (Signed)
Pt advised.

## 2015-11-29 ENCOUNTER — Ambulatory Visit (HOSPITAL_COMMUNITY)
Admission: RE | Admit: 2015-11-29 | Discharge: 2015-11-29 | Disposition: A | Discharge status: Home / Self Care | Payer: BLUE CROSS/BLUE SHIELD | Source: Ambulatory Visit | Attending: Family Medicine | Admitting: Family Medicine

## 2015-11-29 DIAGNOSIS — E7800 Pure hypercholesterolemia, unspecified: Secondary | ICD-10-CM | POA: Insufficient documentation

## 2015-11-29 DIAGNOSIS — K219 Gastro-esophageal reflux disease without esophagitis: Secondary | ICD-10-CM | POA: Insufficient documentation

## 2015-11-29 DIAGNOSIS — I10 Essential (primary) hypertension: Secondary | ICD-10-CM | POA: Diagnosis not present

## 2015-11-29 DIAGNOSIS — R600 Localized edema: Secondary | ICD-10-CM | POA: Diagnosis not present

## 2015-12-05 ENCOUNTER — Encounter: Payer: Self-pay | Admitting: Family Medicine

## 2016-03-22 ENCOUNTER — Other Ambulatory Visit: Payer: Self-pay | Admitting: Family Medicine

## 2016-05-13 ENCOUNTER — Ambulatory Visit (INDEPENDENT_AMBULATORY_CARE_PROVIDER_SITE_OTHER): Payer: BLUE CROSS/BLUE SHIELD | Admitting: Family Medicine

## 2016-05-13 ENCOUNTER — Encounter: Payer: Self-pay | Admitting: Family Medicine

## 2016-05-13 VITALS — BP 114/82 | HR 73 | Temp 98.4°F | Resp 16 | Ht 61.5 in | Wt 162.4 lb

## 2016-05-13 DIAGNOSIS — M5432 Sciatica, left side: Secondary | ICD-10-CM

## 2016-05-13 DIAGNOSIS — J301 Allergic rhinitis due to pollen: Secondary | ICD-10-CM

## 2016-05-13 DIAGNOSIS — K219 Gastro-esophageal reflux disease without esophagitis: Secondary | ICD-10-CM

## 2016-05-13 DIAGNOSIS — I10 Essential (primary) hypertension: Secondary | ICD-10-CM

## 2016-05-13 DIAGNOSIS — E7800 Pure hypercholesterolemia, unspecified: Secondary | ICD-10-CM | POA: Diagnosis not present

## 2016-05-13 LAB — COMPREHENSIVE METABOLIC PANEL
ALBUMIN: 4.1 g/dL (ref 3.6–5.1)
ALK PHOS: 58 U/L (ref 33–130)
ALT: 14 U/L (ref 6–29)
AST: 17 U/L (ref 10–35)
BUN: 18 mg/dL (ref 7–25)
CALCIUM: 9.4 mg/dL (ref 8.6–10.4)
CO2: 23 mmol/L (ref 20–31)
CREATININE: 0.77 mg/dL (ref 0.50–0.99)
Chloride: 106 mmol/L (ref 98–110)
GLUCOSE: 84 mg/dL (ref 65–99)
Potassium: 4.2 mmol/L (ref 3.5–5.3)
Sodium: 142 mmol/L (ref 135–146)
Total Bilirubin: 0.6 mg/dL (ref 0.2–1.2)
Total Protein: 6.5 g/dL (ref 6.1–8.1)

## 2016-05-13 LAB — LIPID PANEL
Cholesterol: 247 mg/dL — ABNORMAL HIGH (ref <200)
HDL: 59 mg/dL (ref >50)
LDL CALC: 156 mg/dL — AB
TRIGLYCERIDES: 161 mg/dL — AB (ref <150)
Total CHOL/HDL Ratio: 4.2 Ratio (ref <5.0)
VLDL: 32 mg/dL — AB (ref <30)

## 2016-05-13 LAB — CBC WITH DIFFERENTIAL/PLATELET
BASOS PCT: 1 %
Basophils Absolute: 61 cells/uL (ref 0–200)
Eosinophils Absolute: 183 cells/uL (ref 15–500)
Eosinophils Relative: 3 %
HEMATOCRIT: 38.4 % (ref 35.0–45.0)
HEMOGLOBIN: 12.8 g/dL (ref 11.7–15.5)
LYMPHS ABS: 2196 {cells}/uL (ref 850–3900)
Lymphocytes Relative: 36 %
MCH: 30.3 pg (ref 27.0–33.0)
MCHC: 33.3 g/dL (ref 32.0–36.0)
MCV: 90.8 fL (ref 80.0–100.0)
MONO ABS: 366 {cells}/uL (ref 200–950)
MPV: 11.4 fL (ref 7.5–12.5)
Monocytes Relative: 6 %
NEUTROS ABS: 3294 {cells}/uL (ref 1500–7800)
Neutrophils Relative %: 54 %
Platelets: 218 10*3/uL (ref 140–400)
RBC: 4.23 MIL/uL (ref 3.80–5.10)
RDW: 13.9 % (ref 11.0–15.0)
WBC: 6.1 10*3/uL (ref 3.8–10.8)

## 2016-05-13 MED ORDER — ESOMEPRAZOLE MAGNESIUM 40 MG PO CPDR: DELAYED_RELEASE_CAPSULE | ORAL | 3 refills | Status: DC

## 2016-05-13 MED ORDER — METANX 3-90.314-2-35 MG PO CAPS: 1.0000 | ORAL_CAPSULE | Freq: Two times a day (BID) | ORAL | 3 refills | Status: DC

## 2016-05-13 NOTE — Patient Instructions (Signed)
     IF you received an x-ray today, you will receive an invoice from [facility]. Please contact [facility] at [phone] with questions or concerns regarding your invoice.   IF you received labwork today, you will receive an invoice from [facility]. Please contact [facility] at [phone] with questions or concerns regarding your invoice.   Our billing staff will not be able to assist you with questions regarding bills from these companies.  You will be contacted with the lab results as soon as they are available. The fastest way to get your results is to activate your My Chart account. Instructions are located on the last page of this paperwork. If you have not heard from us regarding the results in 2 weeks, please contact this office.      

## 2016-05-13 NOTE — Progress Notes (Signed)
Subjective:    Patient ID: Heather Ortiz, female    DOB: 07/30/1945, 70 y.o.   MRN: [id number]  05/13/2016  Hyperlipidemia   HPI This 70 y.o. female presents for six month follow-up of hypertension, hypercholesterolemia, allergic rhinitis. Still waiting on husband to retire.  Still going with husband on trips/work related trips.  Has seen Nehemiah Massed frequently since last visit with me; he changed blood pressure medication; persistent leg swelling with Amlodipine; switched to Cozaar and doing well.  Working great.  Home BP 120s/80s; can go up at times.  BP Readings from Last 3 Encounters:  05/13/16 114/82  11/06/15 120/82  10/24/15 126/82    Wt Readings from Last 3 Encounters:  05/13/16 162 lb 6.4 oz (73.7 kg)  11/06/15 165 lb 8 oz (75.1 kg)  10/24/15 163 lb 12.8 oz (74.3 kg)    No longer taking Gabapentin any longer; almost wrecked while taking it.  Was only taking Gabapentin '300mg'$  qhs; occurred at 4:00pm the following day.  Sciatica: was taking Gabapentin tid PRN; was prescribed at CPE in 11/2015.  Mtanx was recommended by son who is a drug rep; he has samples to try.  Intermittent pain.  Not a constant pain; will hit L calf or feet or in thigh or hip.  Travels constantly.  No specialist at this point; basically at nighttime or evenings.   Three times per week or every night or none.  Ongoing for one year.  Not sure if cholesterol medication was causing joint or or leg pain.  Does go to chiropractor for adjustments; goes once a month with improvement.  Will start lifting a lot of heavy things for holidays.  S/p lumbar spine films 10/2015.     Review of Systems  Constitutional: Negative for chills, diaphoresis, fatigue and fever.  Eyes: Negative for visual disturbance.  Respiratory: Negative for cough and shortness of breath.   Cardiovascular: Negative for chest pain, palpitations and leg swelling.  Gastrointestinal: Negative for abdominal pain, constipation, diarrhea, nausea and  vomiting.  Endocrine: Negative for cold intolerance, heat intolerance, polydipsia, polyphagia and polyuria.  Musculoskeletal: Positive for back pain and myalgias.  Neurological: Negative for dizziness, tremors, seizures, syncope, facial asymmetry, speech difficulty, weakness, light-headedness, numbness and headaches.    Past Medical History:  Diagnosis Date  . Allergic rhinitis, cause unspecified   . Allergy   . Diverticulosis of colon (without mention of hemorrhage)   . Esophagitis, unspecified   . External hemorrhoids without mention of complication   . GERD (gastroesophageal reflux disease)   . Osteopenia   . Pure hypercholesterolemia   . Symptomatic menopausal or female climacteric states   . Unspecified essential hypertension   . Unspecified gastritis and gastroduodenitis without mention of hemorrhage    Past Surgical History:  Procedure Laterality Date  . CARDIAC CATHETERIZATION  2006   Normal coronary arteries. [facility]  . CHOLECYSTECTOMY  06/2005  . COLONOSCOPY  06/06/2012   normal. [facility].  Repeat 5 years.  . TUBAL LIGATION     Allergies  Allergen Reactions  . Prednisone Palpitations  . Bee Venom     Bee Stings  . Crestor [Rosuvastatin]     Joint pain  . Influenza Vaccines     Arm Swelling  . Levaquin [Levofloxacin In D5w]     Joint pain , jittery  . Lipitor [Atorvastatin]     Joint pain  . Mobic [Meloxicam]     Flush face   . Penicillins Hives  . Pravastatin  Joint pain  . Simvastatin     Joint pain  . Sulfa Drugs Cross Reactors Hives  . Zithromax [Azithromycin] Hives  . Ceftin [Cefuroxime Axetil] Rash  . Doxycycline Rash   Current Outpatient Prescriptions  Medication Sig Dispense Refill  . aspirin 81 MG tablet Take 81 mg by mouth daily.    Marland Kitchen BIOTIN PO Take by mouth.    . Cholecalciferol (VITAMIN D-3) 1000 UNITS CAPS Take by mouth daily.    Marland Kitchen co-enzyme Q-10 30 MG capsule Take 200 mg by mouth 3 (three) times daily.    Marland Kitchen esomeprazole (NEXIUM) 40  MG capsule TAKE 1 CAPSULE DAILY BEFORE BREAKFAST 90 capsule 3  . estradiol (ESTRACE VAGINAL) 0.1 MG/GM vaginal cream Place 1 Applicatorful vaginally 3 (three) times a week. 127.5 g 3  . furosemide (LASIX) 20 MG tablet Take 1 tablet (20 mg total) by mouth daily. 90 tablet 1  . KRILL OIL PO Take by mouth.    . loratadine (CLARITIN) 10 MG tablet Take 10 mg by mouth daily.    Marland Kitchen losartan (COZAAR) 50 MG tablet Take 50 mg by mouth daily.    . montelukast (SINGULAIR) 10 MG tablet Take 1 tablet (10 mg total) by mouth at bedtime. 90 tablet 1  . Multiple Vitamins-Minerals (CENTRUM SILVER PO) Take by mouth daily.    Marland Kitchen OVER THE COUNTER MEDICATION Reported on 10/24/2015    . potassium chloride SA (K-DUR,KLOR-CON) 20 MEQ tablet Take 1 tablet (20 mEq total) by mouth daily. 90 tablet 1  . Probiotic Product (ALIGN PO) Take by mouth.    . triamcinolone (NASACORT AQ) 55 MCG/ACT AERO nasal inhaler Place 2 sprays into the nose daily. 16.9 mL 12  . amLODipine (NORVASC) 2.5 MG tablet Take 2.5 mg by mouth daily. Reported on 11/06/2015    . amLODipine (NORVASC) 5 MG tablet Take 5 mg by mouth daily.    Marland Kitchen gabapentin (NEURONTIN) 100 MG capsule Take 1-3 capsules (100-300 mg total) by mouth 3 (three) times daily. (Patient not taking: Reported on 05/13/2016) 540 capsule 0  . L-Methylfolate-Algae-B12-B6 (METANX) 3-90.314-2-35 MG CAPS Take 1 capsule by mouth 2 (two) times daily. 60 capsule 3  . PRESCRIPTION MEDICATION Reported on 10/24/2015     No current facility-administered medications for this visit.    Social History   Social History  . Marital status: Married    Spouse name: N/A  . Number of children: 3  . Years of education: 12   Occupational History  . retired     Psychologist, counselling 2003   Social History Main Topics  . Smoking status: Never Smoker  . Smokeless tobacco: Never Used  . Alcohol use Yes     Comment: once weekly  . Drug use: No  . Sexual activity: Yes   Other Topics Concern  . Not on file   Social  History Narrative   Always uses seat belts.    Married x 46 years; happily; no abuse.       Children:  3 children and 8 grandchildren.      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        Tobacco; never      Alcohol: rare      Drugs: none     Caffeine Use: Coffee, tea, 3 servings / day.       Exercise: Moderate, 4 x weeg walking; Recently joined a gym.      Guns: none   Family History  Problem Relation Age of Onset  .  Hypertension Mother   . Heart disease Mother     CAD/valve replacement/CABG  . Stroke Mother 47    TIA's multiple  . Cancer Mother     bladder  . Glaucoma Mother   . Aortic aneurysm Mother   . Emphysema Father   . Glaucoma Father     also Mother  . COPD Father   . Hyperlipidemia Brother   . Meniere's disease Brother   . Aortic aneurysm    . Hypertension Sister   . Breast cancer Neg Hx        Objective:    BP 114/82   Pulse 73   Temp 98.4 F (36.9 C) (Oral)   Resp 16   Ht 5' 1.5" (1.562 m)   Wt 162 lb 6.4 oz (73.7 kg)   SpO2 96%   BMI 30.19 kg/m  Physical Exam  Constitutional: She is oriented to person, place, and time. She appears well-developed and well-nourished. No distress.  HENT:  Head: Normocephalic and atraumatic.  Right Ear: External ear normal.  Left Ear: External ear normal.  Nose: Nose normal.  Mouth/Throat: Oropharynx is clear and moist.  Eyes: Conjunctivae and EOM are normal. Pupils are equal, round, and reactive to light.  Neck: Normal range of motion. Neck supple. Carotid bruit is not present. No thyromegaly present.  Cardiovascular: Normal rate, regular rhythm, normal heart sounds and intact distal pulses.  Exam reveals no gallop and no friction rub.   No murmur heard. Pulmonary/Chest: Effort normal and breath sounds normal. She has no wheezes. She has no rales.  Abdominal: Soft. Bowel sounds are normal. She exhibits no distension and no mass. There is no tenderness. There is no rebound and no guarding.    Musculoskeletal:       Lumbar back: Normal. She exhibits normal range of motion, no tenderness, no bony tenderness, no swelling, no pain, no spasm and normal pulse.  Lymphadenopathy:    She has no cervical adenopathy.  Neurological: She is alert and oriented to person, place, and time. No cranial nerve deficit.  Skin: Skin is warm and dry. No rash noted. She is not diaphoretic. No erythema. No pallor.  Psychiatric: She has a normal mood and affect. Her behavior is normal.   Results for orders placed or performed in visit on 11/06/15  CBC with Differential/Platelet  Result Value Ref Range   WBC 6.2 3.8 - 10.8 K/uL   RBC 4.42 3.80 - 5.10 MIL/uL   Hemoglobin 13.2 11.7 - 15.5 g/dL   HCT 40.0 35.0 - 45.0 %   MCV 90.5 80.0 - 100.0 fL   MCH 29.9 27.0 - 33.0 pg   MCHC 33.0 32.0 - 36.0 g/dL   RDW 13.5 11.0 - 15.0 %   Platelets 242 140 - 400 K/uL   MPV 11.1 7.5 - 12.5 fL   Neutro Abs 3,038 1,500 - 7,800 cells/uL   Lymphs Abs 2,480 850 - 3,900 cells/uL   Monocytes Absolute 372 200 - 950 cells/uL   Eosinophils Absolute 248 15 - 500 cells/uL   Basophils Absolute 62 0 - 200 cells/uL   Neutrophils Relative % 49 %   Lymphocytes Relative 40 %   Monocytes Relative 6 %   Eosinophils Relative 4 %   Basophils Relative 1 %   Smear Review Criteria for review not met   Comprehensive metabolic panel  Result Value Ref Range   Sodium 142 135 - 146 mmol/L   Potassium 4.0 3.5 - 5.3 mmol/L   Chloride 104  98 - 110 mmol/L   CO2 25 20 - 31 mmol/L   Glucose, Bld 87 65 - 99 mg/dL   BUN 20 7 - 25 mg/dL   Creat 0.83 0.50 - 0.99 mg/dL   Total Bilirubin 0.6 0.2 - 1.2 mg/dL   Alkaline Phosphatase 70 33 - 130 U/L   AST 23 10 - 35 U/L   ALT 19 6 - 29 U/L   Total Protein 7.3 6.1 - 8.1 g/dL   Albumin 4.3 3.6 - 5.1 g/dL   Calcium 9.9 8.6 - 10.4 mg/dL  Lipid panel  Result Value Ref Range   Cholesterol 288 (H) 125 - 200 mg/dL   Triglycerides 178 (H) <150 mg/dL   HDL 67 >=46 mg/dL   Total CHOL/HDL Ratio 4.3  <=5.0 Ratio   VLDL 36 (H) <30 mg/dL   LDL Cholesterol 185 (H) <130 mg/dL  TSH  Result Value Ref Range   TSH 1.55 mIU/L  POCT urinalysis dipstick  Result Value Ref Range   Color, UA yellow yellow   Clarity, UA clear clear   Glucose, UA negative negative   Bilirubin, UA negative negative   Ketones, POC UA negative negative   Spec Grav, UA 1.020    Blood, UA negative negative   pH, UA 6.0    Protein Ur, POC negative negative   Urobilinogen, UA 0.2    Nitrite, UA Negative Negative   Leukocytes, UA Negative Negative       Assessment & Plan:   1. Essential hypertension, benign   2. Chronic seasonal allergic rhinitis due to pollen   3. Gastroesophageal reflux disease without esophagitis   4. Pure hypercholesterolemia   5. Left sided sciatica    -controlled blood pressure; obtain labs. -possible statin intolerance in the past; repeat labs today. -refer to orthopedics due to persistent intermittent L sciatica for the past year. Suffered with hypersomnolence with Gabapentin.  Pt requesting trial of Metanx supplement at request of son; willing to try.  Consider physical therapy. -GERD well controlled.   Orders Placed This Encounter  Procedures  . CBC with Differential/Platelet  . Comprehensive metabolic panel    Order Specific Question:   Has the patient fasted?    Answer:   Yes  . Lipid panel    Order Specific Question:   Has the patient fasted?    Answer:   Yes  . Ambulatory referral to Orthopedic Surgery    Referral Priority:   Routine    Referral Type:   Surgical    Referral Reason:   Specialty Services Required    Requested Specialty:   Orthopedic Surgery    Number of Visits Requested:   1   Meds ordered this encounter  Medications  . losartan (COZAAR) 50 MG tablet    Sig: Take 50 mg by mouth daily.  Marland Kitchen BIOTIN PO    Sig: Take by mouth.  Marland Kitchen KRILL OIL PO    Sig: Take by mouth.  . Probiotic Product (ALIGN PO)    Sig: Take by mouth.  Marland Kitchen L-Methylfolate-Algae-B12-B6  (METANX) 3-90.314-2-35 MG CAPS    Sig: Take 1 capsule by mouth 2 (two) times daily.    Dispense:  60 capsule    Refill:  3  . esomeprazole (NEXIUM) 40 MG capsule    Sig: TAKE 1 CAPSULE DAILY BEFORE BREAKFAST    Dispense:  90 capsule    Refill:  3    Return in about 6 months (around 11/10/2016) for complete physical examiniation.   Heather Ortiz  Heather Ortiz, M.D. [facility] [address] [place], [place]  [address] [phone] phone [phone] fax

## 2016-06-04 ENCOUNTER — Encounter: Payer: Self-pay | Admitting: Family Medicine

## 2016-06-04 DIAGNOSIS — N6310 Unspecified lump in the right breast, unspecified quadrant: Secondary | ICD-10-CM

## 2016-06-05 ENCOUNTER — Other Ambulatory Visit: Payer: Self-pay | Admitting: Family Medicine

## 2016-06-05 DIAGNOSIS — Z1231 Encounter for screening mammogram for malignant neoplasm of breast: Secondary | ICD-10-CM

## 2016-06-16 ENCOUNTER — Other Ambulatory Visit: Payer: Self-pay | Admitting: Physical Medicine and Rehabilitation

## 2016-06-16 DIAGNOSIS — M5136 Other intervertebral disc degeneration, lumbar region: Secondary | ICD-10-CM

## 2016-06-26 ENCOUNTER — Ambulatory Visit
Admission: RE | Admit: 2016-06-26 | Discharge: 2016-06-26 | Disposition: A | Discharge status: Home / Self Care | Payer: BLUE CROSS/BLUE SHIELD | Source: Ambulatory Visit | Attending: Physical Medicine and Rehabilitation | Admitting: Physical Medicine and Rehabilitation

## 2016-06-26 DIAGNOSIS — M5136 Other intervertebral disc degeneration, lumbar region: Secondary | ICD-10-CM | POA: Diagnosis not present

## 2016-06-26 DIAGNOSIS — M47896 Other spondylosis, lumbar region: Secondary | ICD-10-CM | POA: Diagnosis not present

## 2016-06-26 DIAGNOSIS — M5416 Radiculopathy, lumbar region: Secondary | ICD-10-CM | POA: Insufficient documentation

## 2016-06-28 ENCOUNTER — Other Ambulatory Visit: Payer: Self-pay | Admitting: Family Medicine

## 2016-07-01 NOTE — Telephone Encounter (Signed)
06/2016 last ov 

## 2016-07-14 ENCOUNTER — Ambulatory Visit
Admission: RE | Admit: 2016-07-14 | Discharge: 2016-07-14 | Disposition: A | Discharge status: Home / Self Care | Payer: BLUE CROSS/BLUE SHIELD | Source: Ambulatory Visit | Attending: Family Medicine | Admitting: Family Medicine

## 2016-07-14 DIAGNOSIS — Z1231 Encounter for screening mammogram for malignant neoplasm of breast: Secondary | ICD-10-CM | POA: Insufficient documentation

## 2016-11-12 ENCOUNTER — Ambulatory Visit (INDEPENDENT_AMBULATORY_CARE_PROVIDER_SITE_OTHER): Payer: BLUE CROSS/BLUE SHIELD | Admitting: Family Medicine

## 2016-11-12 ENCOUNTER — Encounter: Payer: Self-pay | Admitting: Family Medicine

## 2016-11-12 VITALS — BP 135/84 | HR 77 | Temp 97.7°F | Resp 16 | Ht 61.5 in | Wt 164.2 lb

## 2016-11-12 DIAGNOSIS — I10 Essential (primary) hypertension: Secondary | ICD-10-CM

## 2016-11-12 DIAGNOSIS — E7800 Pure hypercholesterolemia, unspecified: Secondary | ICD-10-CM

## 2016-11-12 DIAGNOSIS — K219 Gastro-esophageal reflux disease without esophagitis: Secondary | ICD-10-CM | POA: Diagnosis not present

## 2016-11-12 DIAGNOSIS — Z131 Encounter for screening for diabetes mellitus: Secondary | ICD-10-CM | POA: Diagnosis not present

## 2016-11-12 DIAGNOSIS — Z23 Encounter for immunization: Secondary | ICD-10-CM | POA: Diagnosis not present

## 2016-11-12 DIAGNOSIS — Z0000 Encounter for general adult medical examination without abnormal findings: Secondary | ICD-10-CM | POA: Diagnosis not present

## 2016-11-12 DIAGNOSIS — J301 Allergic rhinitis due to pollen: Secondary | ICD-10-CM | POA: Diagnosis not present

## 2016-11-12 DIAGNOSIS — J302 Other seasonal allergic rhinitis: Secondary | ICD-10-CM

## 2016-11-12 LAB — POCT URINALYSIS DIP (MANUAL ENTRY)
Bilirubin, UA: NEGATIVE
GLUCOSE UA: NEGATIVE mg/dL
Ketones, POC UA: NEGATIVE mg/dL
Leukocytes, UA: NEGATIVE
NITRITE UA: NEGATIVE
PH UA: 5.5 (ref 5.0–8.0)
Protein Ur, POC: NEGATIVE mg/dL
RBC UA: NEGATIVE
SPEC GRAV UA: 1.025 (ref 1.010–1.025)
Urobilinogen, UA: 0.2 E.U./dL

## 2016-11-12 MED ORDER — TRIAMCINOLONE ACETONIDE 55 MCG/ACT NA AERO: 2.0000 | INHALATION_SPRAY | Freq: Every day | NASAL | 3 refills | Status: DC

## 2016-11-12 MED ORDER — POTASSIUM CHLORIDE CRYS ER 20 MEQ PO TBCR: 20.0000 meq | EXTENDED_RELEASE_TABLET | Freq: Every day | ORAL | 3 refills | Status: DC

## 2016-11-12 MED ORDER — MONTELUKAST SODIUM 10 MG PO TABS: 10.0000 mg | ORAL_TABLET | Freq: Every day | ORAL | 3 refills | Status: DC

## 2016-11-12 MED ORDER — ZOSTER VAC RECOMB ADJUVANTED 50 MCG/0.5ML IM SUSR: 0.5000 mL | Freq: Once | INTRAMUSCULAR | 1 refills | Status: DC

## 2016-11-12 MED ORDER — ZOSTER VAC RECOMB ADJUVANTED 50 MCG/0.5ML IM SUSR: 0.5000 mL | Freq: Once | INTRAMUSCULAR | 1 refills | Status: AC

## 2016-11-12 MED ORDER — FUROSEMIDE 20 MG PO TABS: 20.0000 mg | ORAL_TABLET | Freq: Every day | ORAL | 3 refills | Status: DC

## 2016-11-12 MED ORDER — ESOMEPRAZOLE MAGNESIUM 40 MG PO CPDR: DELAYED_RELEASE_CAPSULE | ORAL | 3 refills | Status: DC

## 2016-11-12 NOTE — Progress Notes (Signed)
Subjective:    Patient ID: Heather Ortiz, female    DOB: 09-25-1945, 71 y.o.   MRN: [id number]  11/12/2016  Annual Exam and Medication Refill (Lasix)   HPI This 71 y.o. female presents for Complete Physical Examination.  Last physical:  11-06-2015 Pap smear:  2015 Mammogram:  07-14-2016 Colonoscopy: 05-31-2012; repeat five years.   Bone density:  05-28-2015 Eye exam:  2018; [facility] Dental exam:  2018  Immunization History  Administered Date(s) Administered  . Pneumococcal Conjugate-13 01/04/2014  . Pneumococcal Polysaccharide-23 12/20/2012  . Tdap 08/14/2009   BP Readings from Last 3 Encounters:  11/12/16 135/84  05/13/16 114/82  11/06/15 120/82   Wt Readings from Last 3 Encounters:  11/12/16 164 lb 3.2 oz (74.5 kg)  05/13/16 162 lb 6.4 oz (73.7 kg)  11/06/15 165 lb 8 oz (75.1 kg)   Husband retiring at end of the year.  Plans to move back to [place], [place].  Grandchildren in [place].  Oldest grandson is graduating in June 2018.    s/p lumbar spine injections by [facility].  s/p MRI lumbar spine.  HTN: Patient reports good compliance with medication, good tolerance to medication, and good symptom control.  Home BP running 145/89-100; now to 117/82.  Recent stress.   Review of Systems  Constitutional: Negative for activity change, appetite change, chills, diaphoresis, fatigue, fever and unexpected weight change.  HENT: Negative for congestion, dental problem, drooling, ear discharge, ear pain, facial swelling, hearing loss, mouth sores, nosebleeds, postnasal drip, rhinorrhea, sinus pressure, sneezing, sore throat, tinnitus, trouble swallowing and voice change.   Eyes: Negative for photophobia, pain, discharge, redness, itching and visual disturbance.  Respiratory: Negative for apnea, cough, choking, chest tightness, shortness of breath, wheezing and stridor.   Cardiovascular: Negative for chest pain, palpitations and leg swelling.  Gastrointestinal: Negative for abdominal  distention, abdominal pain, anal bleeding, blood in stool, constipation, diarrhea, nausea, rectal pain and vomiting.  Endocrine: Negative for cold intolerance, heat intolerance, polydipsia, polyphagia and polyuria.  Genitourinary: Negative for decreased urine volume, difficulty urinating, dyspareunia, dysuria, enuresis, flank pain, frequency, genital sores, hematuria, menstrual problem, pelvic pain, urgency, vaginal bleeding, vaginal discharge and vaginal pain.  Musculoskeletal: Negative for arthralgias, back pain, gait problem, joint swelling, myalgias, neck pain and neck stiffness.  Skin: Negative for color change, pallor, rash and wound.  Allergic/Immunologic: Negative for environmental allergies, food allergies and immunocompromised state.  Neurological: Negative for dizziness, tremors, seizures, syncope, facial asymmetry, speech difficulty, weakness, light-headedness, numbness and headaches.  Hematological: Negative for adenopathy. Does not bruise/bleed easily.  Psychiatric/Behavioral: Negative for agitation, behavioral problems, confusion, decreased concentration, dysphoric mood, hallucinations, self-injury, sleep disturbance and suicidal ideas. The patient is not nervous/anxious and is not hyperactive.     Past Medical History:  Diagnosis Date  . Allergic rhinitis, cause unspecified   . Allergy   . Diverticulosis of colon (without mention of hemorrhage)   . Esophagitis, unspecified   . External hemorrhoids without mention of complication   . GERD (gastroesophageal reflux disease)   . Osteopenia   . Pure hypercholesterolemia   . Symptomatic menopausal or female climacteric states   . Unspecified essential hypertension   . Unspecified gastritis and gastroduodenitis without mention of hemorrhage    Past Surgical History:  Procedure Laterality Date  . CARDIAC CATHETERIZATION  2006   Normal coronary arteries. [facility]  . CHOLECYSTECTOMY  06/2005  . COLONOSCOPY  06/06/2012   normal.  [facility].  Repeat 5 years.  . TUBAL LIGATION     Allergies  Allergen Reactions  . Prednisone Palpitations  . Bee Venom     Bee Stings  . Crestor [Rosuvastatin]     Joint pain  . Influenza Vaccines     Arm Swelling  . Levaquin [Levofloxacin In D5w]     Joint pain , jittery  . Lipitor [Atorvastatin]     Joint pain  . Mobic [Meloxicam]     Flush face   . Penicillins Hives  . Pravastatin     Joint pain  . Simvastatin     Joint pain  . Sulfa Drugs Cross Reactors Hives  . Zithromax [Azithromycin] Hives  . Ceftin [Cefuroxime Axetil] Rash  . Doxycycline Rash    Social History   Social History  . Marital status: Married    Spouse name: N/A  . Number of children: 3  . Years of education: 12   Occupational History  . retired     Education officer, [employer] 2003   Social History Main Topics  . Smoking status: Never Smoker  . Smokeless tobacco: Never Used  . Alcohol use Yes     Comment: once weekly  . Drug use: No  . Sexual activity: Yes    Birth control/ protection: Post-menopausal   Other Topics Concern  . Not on file   Social History Narrative      Marital status:  Married x 46 years; happily; no abuse.       Children:  3 children and 8 grandchildren.      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        Tobacco; never      Alcohol: rare; once per month      Drugs: none     Caffeine Use: Coffee, tea, 3 servings / day.       Exercise: Moderate, 4 x weeg walking; Recently joined a gym.      Guns: none      Seatbelt: 100%; no texting      ADLs: independent with ADLs.; driving; no assistant devices      Advanced Directives:  None; FULL CODE; no prolonged measures      Family History  Problem Relation Age of Onset  . Hypertension Mother   . Heart disease Mother        CAD/valve replacement/CABG  . Stroke Mother 1065       TIA's multiple  . Cancer Mother        bladder  . Glaucoma Mother   . Aortic aneurysm Mother   . Emphysema Father   . Glaucoma Father         also Mother  . COPD Father   . Hyperlipidemia Brother   . Meniere's disease Brother   . Hypertension Sister   . Aortic aneurysm Unknown   . Breast cancer Neg Hx        Objective:    BP 135/84   Pulse 77   Temp 97.7 F (36.5 C) (Oral)   Resp 16   Ht 5' 1.5" (1.562 m)   Wt 164 lb 3.2 oz (74.5 kg)   SpO2 99%   BMI 30.52 kg/m  Physical Exam  Constitutional: She is oriented to person, place, and time. She appears well-developed and well-nourished. No distress.  HENT:  Head: Normocephalic and atraumatic.  Right Ear: External ear normal.  Left Ear: External ear normal.  Nose: Nose normal.  Mouth/Throat: Oropharynx is clear and moist.  Eyes: Conjunctivae and EOM are normal. Pupils are equal, round, and reactive to light.  Neck:  Normal range of motion and full passive range of motion without pain. Neck supple. No JVD present. Carotid bruit is not present. No thyromegaly present.  Cardiovascular: Normal rate, regular rhythm and normal heart sounds.  Exam reveals no gallop and no friction rub.   No murmur heard. Pulmonary/Chest: Effort normal and breath sounds normal. She has no wheezes. She has no rales. Right breast exhibits no inverted nipple, no mass, no nipple discharge, no skin change and no tenderness. Left breast exhibits no inverted nipple, no mass, no nipple discharge, no skin change and no tenderness. Breasts are symmetrical.  Abdominal: Soft. Bowel sounds are normal. She exhibits no distension and no mass. There is no tenderness. There is no rebound and no guarding.  Genitourinary: Vagina normal. There is no rash, tenderness, lesion or injury on the right labia. There is no rash, tenderness, lesion or injury on the left labia.  Musculoskeletal:       Right shoulder: Normal.       Left shoulder: Normal.       Cervical back: Normal.  Lymphadenopathy:    She has no cervical adenopathy.  Neurological: She is alert and oriented to person, place, and time. She has normal  reflexes. No cranial nerve deficit. She exhibits normal muscle tone. Coordination normal.  Skin: Skin is warm and dry. No rash noted. She is not diaphoretic. No erythema. No pallor.  Psychiatric: She has a normal mood and affect. Her behavior is normal. Judgment and thought content normal.  Nursing note and vitals reviewed.  Depression screen [facility] 2/9 11/12/2016 05/13/2016 11/06/2015 10/24/2015 05/04/2015  Decreased Interest 0 0 0 0 0  Down, Depressed, Hopeless 0 0 0 0 0  PHQ - 2 Score 0 0 0 0 0   Fall Risk  11/12/2016 05/13/2016 11/06/2015 10/24/2015 05/04/2015  Falls in the past year? No No No Yes No        Assessment & Plan:   1. Routine physical examination   2. Essential hypertension, benign   3. Seasonal allergic rhinitis due to pollen   4. Gastroesophageal reflux disease without esophagitis   5. Pure hypercholesterolemia   6. Screening for diabetes mellitus   7. Other seasonal allergic rhinitis   8. Need for shingles vaccine    -anticipatory guidance provided --- exercise, weight loss, safe driving practices, aspirin 81mg  daily. -obtain age appropriate screening labs and labs for chronic disease management. -moderate fall risk; no evidence of depression; no evidence of hearing loss.  Discussed advanced directives and living will; also discussed end of life issues including code status.  -refills provided  Orders Placed This Encounter  Procedures  . CBC with Differential/Platelet  . Comprehensive metabolic panel    Order Specific Question:   Has the patient fasted?    Answer:   Yes  . Hemoglobin A1c  . Lipid panel    Order Specific Question:   Has the patient fasted?    Answer:   Yes  . TSH  . POCT urinalysis dipstick   Meds ordered this encounter  Medications  . DISCONTD: Zoster Vac Recomb Adjuvanted [facility]) injection    Sig: Inject 0.5 mLs into the muscle once.    Dispense:  0.5 mL    Refill:  1  . Zoster Vac Recomb Adjuvanted [facility]) injection    Sig: Inject 0.5  mLs into the muscle once.    Dispense:  0.5 mL    Refill:  1  . potassium chloride SA (K-DUR,KLOR-CON) 20 MEQ tablet  Sig: Take 1 tablet (20 mEq total) by mouth daily.    Dispense:  90 tablet    Refill:  3  . esomeprazole (NEXIUM) 40 MG capsule    Sig: TAKE 1 CAPSULE DAILY BEFORE BREAKFAST    Dispense:  90 capsule    Refill:  3  . furosemide (LASIX) 20 MG tablet    Sig: Take 1 tablet (20 mg total) by mouth daily.    Dispense:  90 tablet    Refill:  3  . montelukast (SINGULAIR) 10 MG tablet    Sig: Take 1 tablet (10 mg total) by mouth at bedtime.    Dispense:  90 tablet    Refill:  3  . DISCONTD: triamcinolone (NASACORT AQ) 55 MCG/ACT AERO nasal inhaler    Sig: Place 2 sprays into the nose daily.    Dispense:  48 mL    Refill:  3    Return in about 6 months (around 05/15/2017) for recheck blood pressure.   Shakeda Pearse Paulita Fujita, M.D. [facility] [address] [place], [place]  [address] [phone] phone [phone] fax

## 2016-11-12 NOTE — Patient Instructions (Addendum)
IF you received an x-ray today, you will receive an invoice from [facility]. Please contact [facility] at [phone] with questions or concerns regarding your invoice.   IF you received labwork today, you will receive an invoice from [facility] [facility]. Please contact [facility] at [phone] with questions or concerns regarding your invoice.   Our billing staff will not be able to assist you with questions regarding bills from these companies.  You will be contacted with the lab results as soon as they are available. The fastest way to get your results is to activate your My Chart account. Instructions are located on the last page of this paperwork. If you have not heard from [place] regarding the results in 2 weeks, please contact this office.     p Preventive Care 70 Years and Older, Female Preventive care refers to lifestyle choices and visits with your health care provider that can promote health and wellness. What does preventive care include?  A yearly physical exam. This is also called an annual well check.  Dental exams once or twice a year.  Routine eye exams. Ask your health care provider how often you should have your eyes checked.  Personal lifestyle choices, including:  Daily care of your teeth and gums.  Regular physical activity.  Eating a healthy diet.  Avoiding tobacco and drug use.  Limiting alcohol use.  Practicing safe sex.  Taking low-dose aspirin every day.  Taking vitamin and mineral supplements as recommended by your health care provider. What happens during an annual well check? The services and screenings done by your health care provider during your annual well check will depend on your age, overall health, lifestyle risk factors, and family history of disease. Counseling  Your health care provider may ask you questions about your:  Alcohol use.  Tobacco use.  Drug use.  Emotional well-being.  Home and relationship  well-being.  Sexual activity.  Eating habits.  History of falls.  Memory and ability to understand (cognition).  Work and work Statistician.  Reproductive health. Screening  You may have the following tests or measurements:  Height, weight, and BMI.  Blood pressure.  Lipid and cholesterol levels. These may be checked every 5 years, or more frequently if you are over 70 years old.  Skin check.  Lung cancer screening. You may have this screening every year starting at age 70 if you have a 30-pack-year history of smoking and currently smoke or have quit within the past 15 years.  Fecal occult blood test (FOBT) of the stool. You may have this test every year starting at age 70.  Flexible sigmoidoscopy or colonoscopy. You may have a sigmoidoscopy every 5 years or a colonoscopy every 10 years starting at age 70.  Hepatitis C blood test.  Hepatitis B blood test.  Sexually transmitted disease (STD) testing.  Diabetes screening. This is done by checking your blood sugar (glucose) after you have not eaten for a while (fasting). You may have this done every 1-3 years.  Bone density scan. This is done to screen for osteoporosis. You may have this done starting at age 70.  Mammogram. This may be done every 1-2 years. Talk to your health care provider about how often you should have regular mammograms. Talk with your health care provider about your test results, treatment options, and if necessary, the need for more tests. Vaccines  Your health care provider may recommend certain vaccines, such as:  Influenza vaccine. This is recommended every year.  Tetanus, diphtheria, and acellular pertussis (Tdap, Td) vaccine. You may need a Td booster every 10 years.  Varicella vaccine. You may need this if you have not been vaccinated.  Zoster vaccine. You may need this after age 70.  Measles, mumps, and rubella (MMR) vaccine. You may need at least one dose of MMR if you were born in 1957  or later. You may also need a second dose.  Pneumococcal 13-valent conjugate (PCV13) vaccine. One dose is recommended after age 32.  Pneumococcal polysaccharide (PPSV23) vaccine. One dose is recommended after age 59.  Meningococcal vaccine. You may need this if you have certain conditions.  Hepatitis A vaccine. You may need this if you have certain conditions or if you travel or work in places where you may be exposed to hepatitis A.  Hepatitis B vaccine. You may need this if you have certain conditions or if you travel or work in places where you may be exposed to hepatitis B.  Haemophilus influenzae type b (Hib) vaccine. You may need this if you have certain conditions. Talk to your health care provider about which screenings and vaccines you need and how often you need them. This information is not intended to replace advice given to you by your health care provider. Make sure you discuss any questions you have with your health care provider. Document Released: 07/20/2015 Document Revised: 03/12/2016 Document Reviewed: 04/24/2015 Elsevier Interactive Patient Education  2017 [employer].

## 2016-11-13 ENCOUNTER — Encounter: Payer: Self-pay | Admitting: Family Medicine

## 2016-11-13 DIAGNOSIS — J302 Other seasonal allergic rhinitis: Secondary | ICD-10-CM

## 2016-11-13 DIAGNOSIS — N952 Postmenopausal atrophic vaginitis: Secondary | ICD-10-CM

## 2016-11-13 LAB — TSH: TSH: 2.12 u[IU]/mL (ref 0.450–4.500)

## 2016-11-13 LAB — COMPREHENSIVE METABOLIC PANEL
ALBUMIN: 4.5 g/dL (ref 3.5–4.8)
ALT: 24 IU/L (ref 0–32)
AST: 26 IU/L (ref 0–40)
Albumin/Globulin Ratio: 1.8 (ref 1.2–2.2)
Alkaline Phosphatase: 74 IU/L (ref 39–117)
BUN / CREAT RATIO: 18 (ref 12–28)
BUN: 15 mg/dL (ref 8–27)
Bilirubin Total: 0.5 mg/dL (ref 0.0–1.2)
CO2: 26 mmol/L (ref 18–29)
Calcium: 9.8 mg/dL (ref 8.7–10.3)
Chloride: 100 mmol/L (ref 96–106)
Creatinine, Ser: 0.85 mg/dL (ref 0.57–1.00)
GFR calc non Af Amer: 70 mL/min/{1.73_m2} (ref >59)
GFR, EST AFRICAN AMERICAN: 80 mL/min/{1.73_m2} (ref >59)
GLOBULIN, TOTAL: 2.5 g/dL (ref 1.5–4.5)
Glucose: 90 mg/dL (ref 65–99)
Potassium: 4.7 mmol/L (ref 3.5–5.2)
SODIUM: 142 mmol/L (ref 134–144)
TOTAL PROTEIN: 7 g/dL (ref 6.0–8.5)

## 2016-11-13 LAB — LIPID PANEL
CHOL/HDL RATIO: 5.3 ratio — AB (ref 0.0–4.4)
Cholesterol, Total: 296 mg/dL — ABNORMAL HIGH (ref 100–199)
HDL: 56 mg/dL (ref >39)
LDL CALC: 188 mg/dL — AB (ref 0–99)
TRIGLYCERIDES: 261 mg/dL — AB (ref 0–149)
VLDL CHOLESTEROL CAL: 52 mg/dL — AB (ref 5–40)

## 2016-11-13 LAB — CBC WITH DIFFERENTIAL/PLATELET
BASOS ABS: 0 10*3/uL (ref 0.0–0.2)
Basos: 1 %
EOS (ABSOLUTE): 0.2 10*3/uL (ref 0.0–0.4)
Eos: 4 %
Hematocrit: 41.1 % (ref 34.0–46.6)
Hemoglobin: 13.5 g/dL (ref 11.1–15.9)
IMMATURE GRANS (ABS): 0 10*3/uL (ref 0.0–0.1)
IMMATURE GRANULOCYTES: 0 %
LYMPHS: 38 %
Lymphocytes Absolute: 2.5 10*3/uL (ref 0.7–3.1)
MCH: 30.2 pg (ref 26.6–33.0)
MCHC: 32.8 g/dL (ref 31.5–35.7)
MCV: 92 fL (ref 79–97)
Monocytes Absolute: 0.4 10*3/uL (ref 0.1–0.9)
Monocytes: 6 %
NEUTROS PCT: 51 %
Neutrophils Absolute: 3.4 10*3/uL (ref 1.4–7.0)
PLATELETS: 259 10*3/uL (ref 150–379)
RBC: 4.47 x10E6/uL (ref 3.77–5.28)
RDW: 13.5 % (ref 12.3–15.4)
WBC: 6.6 10*3/uL (ref 3.4–10.8)

## 2016-11-13 LAB — HEMOGLOBIN A1C
Est. average glucose Bld gHb Est-mCnc: 108 mg/dL
HEMOGLOBIN A1C: 5.4 % (ref 4.8–5.6)

## 2016-11-14 MED ORDER — TRIAMCINOLONE ACETONIDE 55 MCG/ACT NA AERO: 2.0000 | INHALATION_SPRAY | Freq: Every day | NASAL | 3 refills | Status: DC

## 2016-11-14 MED ORDER — ESTRADIOL 0.1 MG/GM VA CREA: 1.0000 | TOPICAL_CREAM | VAGINAL | 3 refills | Status: DC

## 2016-11-24 ENCOUNTER — Telehealth: Payer: Self-pay | Admitting: Family Medicine

## 2016-11-24 NOTE — Telephone Encounter (Signed)
Any sub?

## 2016-11-24 NOTE — Telephone Encounter (Signed)
[facility] CALLED TO LET DR Katrinka BlazingSMITH KNOW THAT THE RX FOR TRIAMCINOLONE NASAL SPRAY IS UNAVAILABLE PLEASE CALL [facility] AT [phone] REFERENCE NUMBER [id number]

## 2016-11-28 MED ORDER — LOVASTATIN 20 MG PO TABS: 20.0000 mg | ORAL_TABLET | Freq: Every day | ORAL | 1 refills | Status: DC

## 2016-11-28 NOTE — Telephone Encounter (Signed)
Spoke with patient; she has been using OTC Nasacort nasal spray; no needs at this time.  2.  Discussed FLP results; rx for Lovastatin sent to [facility].  3.  Insurance will only pay for Shingrix at primary care facility.

## 2016-12-27 ENCOUNTER — Other Ambulatory Visit: Payer: Self-pay | Admitting: Family Medicine

## 2017-05-04 ENCOUNTER — Other Ambulatory Visit: Payer: Self-pay | Admitting: Family Medicine

## 2017-05-20 ENCOUNTER — Encounter: Payer: Self-pay | Admitting: Family Medicine

## 2017-05-20 ENCOUNTER — Other Ambulatory Visit: Payer: Self-pay

## 2017-05-20 ENCOUNTER — Ambulatory Visit: Payer: BLUE CROSS/BLUE SHIELD | Admitting: Family Medicine

## 2017-05-20 VITALS — BP 122/68 | HR 75 | Temp 97.9°F | Resp 16 | Ht 61.81 in | Wt 162.4 lb

## 2017-05-20 DIAGNOSIS — E7800 Pure hypercholesterolemia, unspecified: Secondary | ICD-10-CM

## 2017-05-20 DIAGNOSIS — I10 Essential (primary) hypertension: Secondary | ICD-10-CM

## 2017-05-20 DIAGNOSIS — Z1211 Encounter for screening for malignant neoplasm of colon: Secondary | ICD-10-CM

## 2017-05-20 DIAGNOSIS — J301 Allergic rhinitis due to pollen: Secondary | ICD-10-CM | POA: Diagnosis not present

## 2017-05-20 DIAGNOSIS — K219 Gastro-esophageal reflux disease without esophagitis: Secondary | ICD-10-CM | POA: Diagnosis not present

## 2017-05-20 DIAGNOSIS — N952 Postmenopausal atrophic vaginitis: Secondary | ICD-10-CM | POA: Diagnosis not present

## 2017-05-20 MED ORDER — MONTELUKAST SODIUM 10 MG PO TABS: 10.0000 mg | ORAL_TABLET | Freq: Every day | ORAL | 3 refills | Status: DC

## 2017-05-20 MED ORDER — FUROSEMIDE 20 MG PO TABS: 20.0000 mg | ORAL_TABLET | Freq: Every day | ORAL | 3 refills | Status: DC

## 2017-05-20 MED ORDER — AZELASTINE HCL 0.1 % NA SOLN: 2.0000 | Freq: Two times a day (BID) | NASAL | 3 refills | Status: DC

## 2017-05-20 MED ORDER — ESTRADIOL 0.1 MG/GM VA CREA: 1.0000 | TOPICAL_CREAM | VAGINAL | 3 refills | Status: DC

## 2017-05-20 MED ORDER — POTASSIUM CHLORIDE CRYS ER 20 MEQ PO TBCR: 20.0000 meq | EXTENDED_RELEASE_TABLET | Freq: Every day | ORAL | 3 refills | Status: DC

## 2017-05-20 MED ORDER — ESOMEPRAZOLE MAGNESIUM 40 MG PO CPDR: DELAYED_RELEASE_CAPSULE | ORAL | 3 refills | Status: DC

## 2017-05-20 NOTE — Progress Notes (Signed)
Subjective:    Patient ID: Heather Ortiz, female    DOB: 1946-02-13, 71 y.o.   MRN: [id number]  05/20/2017  Hypertension (6 month follow-up) and Hyperlipidemia    HPI This 71 y.o. female presents for SIX MONTH follow-up of hypertension, hypercholesterolemia.  No management changes made at last visit.  Saw cardiology in June 2018; follow-up in December 2018.  BLood pressure is stable at home; able to tolerate Mevacor.  R sciatica was likely unrelated to statin therapy.   Was in [place] last week; had lost ten pounds until last week. Husband traveling with work.  Trying to sell house; [place], [place].  Hard time selling the house; just put on the market in September.  Time for colonoscopy.  BP Readings from Last 3 Encounters:  05/20/17 122/68  11/12/16 135/84  05/13/16 114/82   Wt Readings from Last 3 Encounters:  05/20/17 162 lb 6.4 oz (73.7 kg)  11/12/16 164 lb 3.2 oz (74.5 kg)  05/13/16 162 lb 6.4 oz (73.7 kg)   Immunization History  Administered Date(s) Administered  . Pneumococcal Conjugate-13 01/04/2014  . Pneumococcal Polysaccharide-23 12/20/2012  . Tdap 08/14/2009    Review of Systems  Constitutional: Negative for chills, diaphoresis, fatigue and fever.  Eyes: Negative for visual disturbance.  Respiratory: Negative for cough and shortness of breath.   Cardiovascular: Negative for chest pain, palpitations and leg swelling.  Gastrointestinal: Negative for abdominal pain, constipation, diarrhea, nausea and vomiting.  Endocrine: Negative for cold intolerance, heat intolerance, polydipsia, polyphagia and polyuria.  Neurological: Negative for dizziness, tremors, seizures, syncope, facial asymmetry, speech difficulty, weakness, light-headedness, numbness and headaches.    Past Medical History:  Diagnosis Date  . Allergic rhinitis, cause unspecified   . Allergy   . Diverticulosis of colon (without mention of hemorrhage)   . Esophagitis, unspecified   . External  hemorrhoids without mention of complication   . GERD (gastroesophageal reflux disease)   . Osteopenia   . Pure hypercholesterolemia   . Symptomatic menopausal or female climacteric states   . Unspecified essential hypertension   . Unspecified gastritis and gastroduodenitis without mention of hemorrhage    Past Surgical History:  Procedure Laterality Date  . CARDIAC CATHETERIZATION  2006   Normal coronary arteries. [place]  . CHOLECYSTECTOMY  06/2005  . COLONOSCOPY  06/06/2012   normal. [place].  Repeat 5 years.  . TUBAL LIGATION     Allergies  Allergen Reactions  . Prednisone Palpitations  . Bee Venom     Bee Stings  . Crestor [Rosuvastatin]     Joint pain  . Influenza Vaccines     Arm Swelling  . Levaquin [Levofloxacin In D5w]     Joint pain , jittery  . Lipitor [Atorvastatin]     Joint pain  . Mobic [Meloxicam]     Flush face   . Penicillins Hives  . Pravastatin     Joint pain  . Simvastatin     Joint pain  . Sulfa Drugs Cross Reactors Hives  . Zithromax [Azithromycin] Hives  . Ceftin [Cefuroxime Axetil] Rash  . Doxycycline Rash   Current Outpatient Medications on File Prior to Visit  Medication Sig Dispense Refill  . aspirin 81 MG tablet Take 81 mg by mouth daily.    . Calcium Carbonate-Vitamin D 600-400 MG-UNIT tablet Take by mouth.    . Cholecalciferol (VITAMIN D3) 1000 units CAPS Take by mouth.    . co-enzyme Q-10 30 MG capsule Take 200 mg by mouth 3 (three) times daily.    Marland Kitchen  KRILL OIL PO Take by mouth.    . loratadine (CLARITIN) 10 MG tablet Take by mouth.    . losartan (COZAAR) 50 MG tablet Take 50 mg by mouth daily.    Marland Kitchen lovastatin (MEVACOR) 20 MG tablet TAKE 1 TABLET AT BEDTIME 90 tablet 1  . Multiple Vitamins-Minerals (CENTRUM SILVER PO) Take by mouth daily.    Marland Kitchen OVER THE COUNTER MEDICATION Reported on 10/24/2015    . Probiotic Product (ALIGN PO) Take by mouth.    . triamcinolone (NASACORT AQ) 55 MCG/ACT AERO nasal inhaler Place 2 sprays into the nose  daily. 48 mL 3   No current facility-administered medications on file prior to visit.    Social History   Socioeconomic History  . Marital status: Married    Spouse name: Not on file  . Number of children: 3  . Years of education: 56  . Highest education level: Not on file  Social Needs  . Financial resource strain: Not on file  . Food insecurity - worry: Not on file  . Food insecurity - inability: Not on file  . Transportation needs - medical: Not on file  . Transportation needs - non-medical: Not on file  Occupational History  . Occupation: retired    Comment: Education officer, community 2003  Tobacco Use  . Smoking status: Never Smoker  . Smokeless tobacco: Never Used  Substance and Sexual Activity  . Alcohol use: Yes    Comment: once weekly  . Drug use: No  . Sexual activity: Yes    Birth control/protection: Post-menopausal  Other Topics Concern  . Not on file  Social History Narrative      Marital status:  Married x 46 years; happily; no abuse.       Children:  3 children and 8 grandchildren.      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        Tobacco; never      Alcohol: rare; once per month      Drugs: none     Caffeine Use: Coffee, tea, 3 servings / day.       Exercise: Moderate, 4 x weeg walking; Recently joined a gym.      Guns: none      Seatbelt: 100%; no texting      ADLs: independent with ADLs.; driving; no assistant devices      Advanced Directives:  None; FULL CODE; no prolonged measures   Family History  Problem Relation Age of Onset  . Hypertension Mother   . Heart disease Mother        CAD/valve replacement/CABG  . Stroke Mother 31       TIA's multiple  . Cancer Mother        bladder  . Glaucoma Mother   . Aortic aneurysm Mother   . Emphysema Father   . Glaucoma Father        also Mother  . COPD Father   . Hyperlipidemia Brother   . Meniere's disease Brother   . Hypertension Sister   . Aortic aneurysm Unknown   . Breast cancer Neg Hx         Objective:    BP 122/68   Pulse 75   Temp 97.9 F (36.6 C) (Oral)   Resp 16   Ht 5' 1.81" (1.57 m)   Wt 162 lb 6.4 oz (73.7 kg)   SpO2 100%   BMI 29.89 kg/m  Physical Exam  Constitutional: She is oriented to person, place, and time.  She appears well-developed and well-nourished. No distress.  HENT:  Head: Normocephalic and atraumatic.  Right Ear: External ear normal.  Left Ear: External ear normal.  Nose: Nose normal.  Mouth/Throat: Oropharynx is clear and moist.  Eyes: Conjunctivae and EOM are normal. Pupils are equal, round, and reactive to light.  Neck: Normal range of motion. Neck supple. Carotid bruit is not present. No thyromegaly present.  Cardiovascular: Normal rate, regular rhythm and intact distal pulses. Exam reveals no gallop and no friction rub.  Murmur heard.  Systolic murmur is present with a grade of 2/6. Pulmonary/Chest: Effort normal and breath sounds normal. She has no wheezes. She has no rales.  Abdominal: Soft. Bowel sounds are normal. She exhibits no distension and no mass. There is no tenderness. There is no rebound and no guarding.  Lymphadenopathy:    She has no cervical adenopathy.  Neurological: She is alert and oriented to person, place, and time. No cranial nerve deficit.  Skin: Skin is warm and dry. No rash noted. She is not diaphoretic. No erythema. No pallor.  Psychiatric: She has a normal mood and affect. Her behavior is normal.   No results found. [facility] screen [facility] 2[facility] 05/20/2017 11/12/2016 05/13/2016 11/06/2015 10/24/2015  Decreased Interest 0 0 0 0 0  Down, Depressed, Hopeless 0 0 0 0 0  PHQ - 2 Score 0 0 0 0 0   Fall Risk  05/20/2017 11/12/2016 05/13/2016 11/06/2015 10/24/2015  Falls in the past year? No No No No Yes  Comment - - - - -        Assessment & Plan:   1. Essential hypertension, benign   2. Gastroesophageal reflux disease without esophagitis   3. Pure hypercholesterolemia   4. Colon cancer screening   5. Atrophic  vaginitis   6. Seasonal allergic rhinitis due to pollen     Controlled hypertension, hypercholesterolemia, allergic rhinitis, GERD.  Obtain labs for chronic medical management.  Refills of chronic medications provided without adjustments.  Refer to gastroenterology for repeat colonoscopy.  [place] back [place] house [place].  I wished her the very best with her future endeavors.  It is been a pleasure providing medical care to her over the past years.  Orders Placed This Encounter  Procedures  . Comprehensive metabolic panel    Order Specific Question:   Has the patient fasted?    Answer:   No  . Lipid panel    Order Specific Question:   Has the patient fasted?    Answer:   No  . Ambulatory referral to Gastroenterology    Referral Priority:   Routine    Referral Type:   Consultation    Referral Reason:   Specialty Services Required    Number of Visits Requested:   1   Meds ordered this encounter  Medications  . azelastine (ASTELIN) 0.1 % nasal spray    Sig: Place 2 sprays 2 (two) times daily into both nostrils. Use in each nostril as directed    Dispense:  90 mL    Refill:  3  . esomeprazole (NEXIUM) 40 MG capsule    Sig: TAKE 1 CAPSULE DAILY BEFORE BREAKFAST    Dispense:  90 capsule    Refill:  3  . estradiol (ESTRACE VAGINAL) 0.1 MG/GM vaginal cream    Sig: Place 1 Applicatorful 3 (three) times a week vaginally.    Dispense:  127.5 g    Refill:  3  .  furosemide (LASIX) 20 MG tablet    Sig: Take 1 tablet (20 mg total) daily by mouth.    Dispense:  90 tablet    Refill:  3  . montelukast (SINGULAIR) 10 MG tablet    Sig: Take 1 tablet (10 mg total) at bedtime by mouth.    Dispense:  90 tablet    Refill:  3  . potassium chloride SA (K-DUR,KLOR-CON) 20 MEQ tablet    Sig: Take 1 tablet (20 mEq total) daily by mouth.    Dispense:  90 tablet    Refill:  3    Return in about 6 months (around 11/17/2017) for complete physical  examiniation.   Suki Crockett Paulita FujitaMartin Adalai Perl, M.D. [facility] at [facility]  [place] [facility] [address] [place], [place]  [id number] [phone] phone [phone] fax

## 2017-05-20 NOTE — Patient Instructions (Addendum)
   IF you received an x-ray today, you will receive an invoice from [facility]. Please contact [facility] at [phone] with questions or concerns regarding your invoice.   IF you received labwork today, you will receive an invoice from [facility]. Please contact [facility] at [phone] with questions or concerns regarding your invoice.   Our billing staff will not be able to assist you with questions regarding bills from these companies.  You will be contacted with the lab results as soon as they are available. The fastest way to get your results is to activate your My Chart account. Instructions are located on the last page of this paperwork. If you have not heard from us regarding the results in 2 weeks, please contact this office.      Mediterranean Diet A Mediterranean diet refers to food and lifestyle choices that are based on the traditions of countries located on the [place] Sea. This way of eating has been shown to help prevent certain conditions and improve outcomes for people who have chronic diseases, like kidney disease and heart disease. What are tips for following this plan? Lifestyle  Cook and eat meals together with your family, when possible.  Drink enough fluid to keep your urine clear or pale yellow.  Be physically active every day. This includes: ? Aerobic exercise like running or swimming. ? Leisure activities like gardening, walking, or housework.  Get 7-8 hours of sleep each night.  If recommended by your health care provider, drink red wine in moderation. This means 1 glass a day for nonpregnant women and 2 glasses a day for men. A glass of wine equals 5 oz (150 mL). Reading food labels  Check the serving size of packaged foods. For foods such as rice and pasta, the serving size refers to the amount of cooked product, not dry.  Check the total fat in packaged foods. Avoid foods that have saturated fat or trans fats.  Check the  ingredients list for added sugars, such as corn syrup. Shopping  At the grocery store, buy most of your food from the areas near the walls of the store. This includes: ? Fresh fruits and vegetables (produce). ? Grains, beans, nuts, and seeds. Some of these may be available in unpackaged forms or large amounts (in bulk). ? Fresh seafood. ? Poultry and eggs. ? Low-fat dairy products.  Buy whole ingredients instead of prepackaged foods.  Buy fresh fruits and vegetables in-season from local farmers markets.  Buy frozen fruits and vegetables in resealable bags.  If you do not have access to quality fresh seafood, buy precooked frozen shrimp or canned fish, such as tuna, salmon, or sardines.  Buy small amounts of raw or cooked vegetables, salads, or olives from the deli or salad bar at your store.  Stock your pantry so you always have certain foods on hand, such as olive oil, canned tuna, canned tomatoes, rice, pasta, and beans. Cooking  Cook foods with extra-virgin olive oil instead of using butter or other vegetable oils.  Have meat as a side dish, and have vegetables or grains as your main dish. This means having meat in small portions or adding small amounts of meat to foods like pasta or stew.  Use beans or vegetables instead of meat in common dishes like chili or lasagna.  Experiment with different cooking methods. Try roasting or broiling vegetables instead of steaming or sauteing them.  Add frozen vegetables to soups, stews, pasta, or rice.  Add nuts or   seeds for added healthy fat at each meal. You can add these to yogurt, salads, or vegetable dishes.  Marinate fish or vegetables using olive oil, lemon juice, garlic, and fresh herbs. Meal planning  Plan to eat 1 vegetarian meal one day each week. Try to work up to 2 vegetarian meals, if possible.  Eat seafood 2 or more times a week.  Have healthy snacks readily available, such as: ? Vegetable sticks with hummus. ? Greek  yogurt. ? Fruit and nut trail mix.  Eat balanced meals throughout the week. This includes: ? Fruit: 2-3 servings a day ? Vegetables: 4-5 servings a day ? Low-fat dairy: 2 servings a day ? Fish, poultry, or lean meat: 1 serving a day ? Beans and legumes: 2 or more servings a week ? Nuts and seeds: 1-2 servings a day ? Whole grains: 6-8 servings a day ? Extra-virgin olive oil: 3-4 servings a day  Limit red meat and sweets to only a few servings a month What are my food choices?  Mediterranean diet ? Recommended ? Grains: Whole-grain pasta. Brown rice. Bulgar wheat. Polenta. Couscous. Whole-wheat bread. Oatmeal. Quinoa. ? Vegetables: Artichokes. Beets. Broccoli. Cabbage. Carrots. Eggplant. Green beans. Chard. Kale. Spinach. Onions. Leeks. Peas. Squash. Tomatoes. Peppers. Radishes. ? Fruits: Apples. Apricots. Avocado. Berries. Bananas. Cherries. Dates. Figs. Grapes. Lemons. Melon. Oranges. Peaches. Plums. Pomegranate. ? Meats and other protein foods: Beans. Almonds. Sunflower seeds. Pine nuts. Peanuts. Cod. Salmon. Scallops. Shrimp. Tuna. Tilapia. Clams. Oysters. Eggs. ? Dairy: Low-fat milk. Cheese. Greek yogurt. ? Beverages: Water. Red wine. Herbal tea. ? Fats and oils: Extra virgin olive oil. Avocado oil. Grape seed oil. ? Sweets and desserts: Greek yogurt with honey. Baked apples. Poached pears. Trail mix. ? Seasoning and other foods: Basil. Cilantro. Coriander. Cumin. Mint. Parsley. Sage. Rosemary. Tarragon. Garlic. Oregano. Thyme. Pepper. Balsalmic vinegar. Tahini. Hummus. Tomato sauce. Olives. Mushrooms. ? Limit these ? Grains: Prepackaged pasta or rice dishes. Prepackaged cereal with added sugar. ? Vegetables: Deep fried potatoes (french fries). ? Fruits: Fruit canned in syrup. ? Meats and other protein foods: Beef. Pork. Lamb. Poultry with skin. Hot dogs. Bacon. ? Dairy: Ice cream. Sour cream. Whole milk. ? Beverages: Juice. Sugar-sweetened soft drinks. Beer. Liquor and  spirits. ? Fats and oils: Butter. Canola oil. Vegetable oil. Beef fat (tallow). Lard. ? Sweets and desserts: Cookies. Cakes. Pies. Candy. ? Seasoning and other foods: Mayonnaise. Premade sauces and marinades. ? The items listed may not be a complete list. Talk with your dietitian about what dietary choices are right for you. Summary  The Mediterranean diet includes both food and lifestyle choices.  Eat a variety of fresh fruits and vegetables, beans, nuts, seeds, and whole grains.  Limit the amount of red meat and sweets that you eat.  Talk with your health care provider about whether it is safe for you to drink red wine in moderation. This means 1 glass a day for nonpregnant women and 2 glasses a day for men. A glass of wine equals 5 oz (150 mL). This information is not intended to replace advice given to you by your health care provider. Make sure you discuss any questions you have with your health care provider. Document Released: 02/14/2016 Document Revised: 03/18/2016 Document Reviewed: 02/14/2016 Elsevier Interactive Patient Education  2018 [employer]  

## 2017-05-21 LAB — COMPREHENSIVE METABOLIC PANEL
ALBUMIN: 4.3 g/dL (ref 3.5–4.8)
ALT: 21 IU/L (ref 0–32)
AST: 23 IU/L (ref 0–40)
Albumin/Globulin Ratio: 1.8 (ref 1.2–2.2)
Alkaline Phosphatase: 70 IU/L (ref 39–117)
BUN / CREAT RATIO: 18 (ref 12–28)
BUN: 15 mg/dL (ref 8–27)
Bilirubin Total: 0.6 mg/dL (ref 0.0–1.2)
CO2: 24 mmol/L (ref 20–29)
CREATININE: 0.84 mg/dL (ref 0.57–1.00)
Calcium: 9.3 mg/dL (ref 8.7–10.3)
Chloride: 105 mmol/L (ref 96–106)
GFR calc non Af Amer: 71 mL/min/{1.73_m2} (ref >59)
GFR, EST AFRICAN AMERICAN: 81 mL/min/{1.73_m2} (ref >59)
GLUCOSE: 89 mg/dL (ref 65–99)
Globulin, Total: 2.4 g/dL (ref 1.5–4.5)
Potassium: 4.3 mmol/L (ref 3.5–5.2)
Sodium: 145 mmol/L — ABNORMAL HIGH (ref 134–144)
TOTAL PROTEIN: 6.7 g/dL (ref 6.0–8.5)

## 2017-05-21 LAB — LIPID PANEL
Chol/HDL Ratio: 3.7 ratio (ref 0.0–4.4)
Cholesterol, Total: 212 mg/dL — ABNORMAL HIGH (ref 100–199)
HDL: 58 mg/dL (ref >39)
LDL CALC: 130 mg/dL — AB (ref 0–99)
Triglycerides: 121 mg/dL (ref 0–149)
VLDL Cholesterol Cal: 24 mg/dL (ref 5–40)

## 2017-06-24 ENCOUNTER — Other Ambulatory Visit: Payer: Self-pay

## 2017-06-24 ENCOUNTER — Encounter: Payer: Self-pay | Admitting: Family Medicine

## 2017-06-24 ENCOUNTER — Ambulatory Visit: Payer: BLUE CROSS/BLUE SHIELD | Admitting: Family Medicine

## 2017-06-24 VITALS — BP 130/82 | HR 91 | Temp 98.6°F | Resp 16 | Ht 61.81 in | Wt 159.0 lb

## 2017-06-24 DIAGNOSIS — J0101 Acute recurrent maxillary sinusitis: Secondary | ICD-10-CM

## 2017-06-24 MED ORDER — CLINDAMYCIN HCL 300 MG PO CAPS: 300.0000 mg | ORAL_CAPSULE | Freq: Three times a day (TID) | ORAL | 0 refills | Status: DC

## 2017-06-24 MED ORDER — GUAIFENESIN-CODEINE 100-10 MG/5ML PO SOLN: 5.0000 mL | ORAL | 0 refills | Status: DC | PRN

## 2017-06-24 NOTE — Patient Instructions (Addendum)
Use Astelin at bedtime. Use Nasacort AQ every morning. Afrin twice daily no more than one week. Continue Mucinex Sinus formula.   Sinusitis, Adult Sinusitis is soreness and inflammation of your sinuses. Sinuses are hollow spaces in the bones around your face. Your sinuses are located:  Around your eyes.  In the middle of your forehead.  Behind your nose.  In your cheekbones.  Your sinuses and nasal passages are lined with a stringy fluid (mucus). Mucus normally drains out of your sinuses. When your nasal tissues become inflamed or swollen, the mucus can become trapped or blocked so air cannot flow through your sinuses. This allows bacteria, viruses, and funguses to grow, which leads to infection. Sinusitis can develop quickly and last for 7?10 days (acute) or for more than 12 weeks (chronic). Sinusitis often develops after a cold. What are the causes? This condition is caused by anything that creates swelling in the sinuses or stops mucus from draining, including:  Allergies.  Asthma.  Bacterial or viral infection.  Abnormally shaped bones between the nasal passages.  Nasal growths that contain mucus (nasal polyps).  Narrow sinus openings.  Pollutants, such as chemicals or irritants in the air.  A foreign object stuck in the nose.  A fungal infection. This is rare.  What increases the risk? The following factors may make you more likely to develop this condition:  Having allergies or asthma.  Having had a recent cold or respiratory tract infection.  Having structural deformities or blockages in your nose or sinuses.  Having a weak immune system.  Doing a lot of swimming or diving.  Overusing nasal sprays.  Smoking.  What are the signs or symptoms? The main symptoms of this condition are pain and a feeling of pressure around the affected sinuses. Other symptoms include:  Upper toothache.  Earache.  Headache.  Bad breath.  Decreased sense of smell  and taste.  A cough that may get worse at night.  Fatigue.  Fever.  Thick drainage from your nose. The drainage is often green and it may contain pus (purulent).  Stuffy nose or congestion.  Postnasal drip. This is when extra mucus collects in the throat or back of the nose.  Swelling and warmth over the affected sinuses.  Sore throat.  Sensitivity to light.  How is this diagnosed? This condition is diagnosed based on symptoms, a medical history, and a physical exam. To find out if your condition is acute or chronic, your health care provider may:  Look in your nose for signs of nasal polyps.  Tap over the affected sinus to check for signs of infection.  View the inside of your sinuses using an imaging device that has a light attached (endoscope).  If your health care provider suspects that you have chronic sinusitis, you may also:  Be tested for allergies.  Have a sample of mucus taken from your nose (nasal culture) and checked for bacteria.  Have a mucus sample examined to see if your sinusitis is related to an allergy.  If your sinusitis does not respond to treatment and it lasts longer than 8 weeks, you may have an MRI or CT scan to check your sinuses. These scans also help to determine how severe your infection is. In rare cases, a bone biopsy may be done to rule out more serious types of fungal sinus disease. How is this treated? Treatment for sinusitis depends on the cause and whether your condition is chronic or acute. If a virus is  causing your sinusitis, your symptoms will go away on their own within 10 days. You may be given medicines to relieve your symptoms, including:  Topical nasal decongestants. They shrink swollen nasal passages and let mucus drain from your sinuses.  Antihistamines. These drugs block inflammation that is triggered by allergies. This can help to ease swelling in your nose and sinuses.  Topical nasal corticosteroids. These are nasal  sprays that ease inflammation and swelling in your nose and sinuses.  Nasal saline washes. These rinses can help to get rid of thick mucus in your nose.  If your condition is caused by bacteria, you will be given an antibiotic medicine. If your condition is caused by a fungus, you will be given an antifungal medicine. Surgery may be needed to correct underlying conditions, such as narrow nasal passages. Surgery may also be needed to remove polyps. Follow these instructions at home: Medicines  Take, use, or apply over-the-counter and prescription medicines only as told by your health care provider. These may include nasal sprays.  If you were prescribed an antibiotic medicine, take it as told by your health care provider. Do not stop taking the antibiotic even if you start to feel better. Hydrate and Humidify  Drink enough water to keep your urine clear or pale yellow. Staying hydrated will help to thin your mucus.  Use a cool mist humidifier to keep the humidity level in your home above 50%.  Inhale steam for 10-15 minutes, 3-4 times a day or as told by your health care provider. You can do this in the bathroom while a hot shower is running.  Limit your exposure to cool or dry air. Rest  Rest as much as possible.  Sleep with your head raised (elevated).  Make sure to get enough sleep each night. General instructions  Apply a warm, moist washcloth to your face 3-4 times a day or as told by your health care provider. This will help with discomfort.  Wash your hands often with soap and water to reduce your exposure to viruses and other germs. If soap and water are not available, use hand sanitizer.  Do not smoke. Avoid being around people who are smoking (secondhand smoke).  Keep all follow-up visits as told by your health care provider. This is important. Contact a health care provider if:  You have a fever.  Your symptoms get worse.  Your symptoms do not improve within 10  days. Get help right away if:  You have a severe headache.  You have persistent vomiting.  You have pain or swelling around your face or eyes.  You have vision problems.  You develop confusion.  Your neck is stiff.  You have trouble breathing. This information is not intended to replace advice given to you by your health care provider. Make sure you discuss any questions you have with your health care provider. Document Released: 06/23/2005 Document Revised: 02/17/2016 Document Reviewed: 04/18/2015 Elsevier Interactive Patient Education  2018 [employer]  IF you received an x-ray today, you will receive an invoice from [facility]. Please contact [facility] at [phone] with questions or concerns regarding your invoice.   IF you received labwork today, you will receive an invoice from [facility]. Please contact [facility] at [phone] with questions or concerns regarding your invoice.   Our billing staff will not be able to assist you with questions regarding bills from these companies.  You will be contacted with the lab results as soon as they are available. The  fastest way to get your results is to activate your My Chart account. Instructions are located on the last page of this paperwork. If you have not heard from [place] regarding the results in 2 weeks, please contact this office.

## 2017-06-24 NOTE — Progress Notes (Signed)
Subjective:    Patient ID: Heather Ortiz, female    DOB: July 13, 1945, 71 y.o.   MRN: [id number]  06/24/2017  Cough and Nasal Congestion (with some chest congestion and ear drainage x 3 weeks )    HPI This 71 y.o. female presents for evaluation of sinusitis.  Onset three weeks ago.  Completed all antibiotics.  [place]  Improved with minimal residual cough.  Then five days ago, cough has progressively worsened.  Woke up at 3am because of the cough and nasal congestion; unable to breathe through nose.  No fever/sweats but +chills.  +HA.  No ear pain now; both ears were hurting last night.  No rhinorrhea; +nasal congestion; drainage is clear to white-yellow-green.  Still having R sided sinus pressure with improvement and then worsening.  Cough is horrible; +sputum yellow green white; not thick.  No SOB today.  No wheezing.  No n/v/d.  No body aches.  In bed all day yesterday. Has been taking Mucinex sinus and congestion;   [facility] sinus[facility] on 06/03/17.  Prescribed Clindamycin tid 300mg , Robitussin with Codeine; avoid caffeine and milk products; coricidin HBP recommended.  Onset immediately after Thanksgiving.     BP Readings from Last 3 Encounters:  06/24/17 130/82  05/20/17 122/68  11/12/16 135/84   Wt Readings from Last 3 Encounters:  06/24/17 159 lb (72.1 kg)  05/20/17 162 lb 6.4 oz (73.7 kg)  11/12/16 164 lb 3.2 oz (74.5 kg)   Immunization History  Administered Date(s) Administered  . Pneumococcal Conjugate-13 01/04/2014  . Pneumococcal Polysaccharide-23 12/20/2012  . Tdap 08/14/2009    Review of Systems  Constitutional: Negative for chills, diaphoresis, fatigue and fever.  HENT: Positive for congestion, postnasal drip, rhinorrhea, sinus pressure and sinus pain. Negative for ear pain, sore throat and trouble swallowing.   Respiratory: Positive for cough. Negative for shortness of breath and wheezing.   Cardiovascular:  Negative for chest pain, palpitations and leg swelling.  Gastrointestinal: Negative for abdominal pain, constipation, diarrhea, nausea and vomiting.  Neurological: Positive for headaches.    Past Medical History:  Diagnosis Date  . Allergic rhinitis, cause unspecified   . Allergy   . Diverticulosis of colon (without mention of hemorrhage)   . Esophagitis, unspecified   . External hemorrhoids without mention of complication   . GERD (gastroesophageal reflux disease)   . Osteopenia   . Pure hypercholesterolemia   . Symptomatic menopausal or female climacteric states   . Unspecified essential hypertension   . Unspecified gastritis and gastroduodenitis without mention of hemorrhage    Past Surgical History:  Procedure Laterality Date  . CARDIAC CATHETERIZATION  2006   Normal coronary arteries. [place]  . CHOLECYSTECTOMY  06/2005  . COLONOSCOPY  06/06/2012   normal. [place].  Repeat 5 years.  . TUBAL LIGATION     Allergies  Allergen Reactions  . Prednisone Palpitations  . Bee Venom     Bee Stings  . Crestor [Rosuvastatin]     Joint pain  . Influenza Vaccines     Arm Swelling  . Levaquin [Levofloxacin In D5w]     Joint pain , jittery  . Lipitor [Atorvastatin]     Joint pain  . Mobic [Meloxicam]     Flush face   . Penicillins Hives  . Pravastatin     Joint pain  . Simvastatin     Joint pain  . Sulfa Drugs Cross Reactors Hives  . Zithromax [Azithromycin] Hives  .  Ceftin [Cefuroxime Axetil] Rash  . Doxycycline Rash   Current Outpatient Medications on File Prior to Visit  Medication Sig Dispense Refill  . aspirin 81 MG tablet Take 81 mg by mouth daily.    Marland Kitchen. azelastine (ASTELIN) 0.1 % nasal spray Place 2 sprays 2 (two) times daily into both nostrils. Use in each nostril as directed 90 mL 3  . Calcium Carbonate-Vitamin D 600-400 MG-UNIT tablet Take by mouth.    . Cholecalciferol (VITAMIN D3) 1000 units CAPS Take by mouth.    . co-enzyme Q-10 30 MG capsule Take 200 mg  by mouth 3 (three) times daily.    Marland Kitchen. esomeprazole (NEXIUM) 40 MG capsule TAKE 1 CAPSULE DAILY BEFORE BREAKFAST 90 capsule 3  . estradiol (ESTRACE VAGINAL) 0.1 MG/GM vaginal cream Place 1 Applicatorful 3 (three) times a week vaginally. 127.5 g 3  . furosemide (LASIX) 20 MG tablet Take 1 tablet (20 mg total) daily by mouth. 90 tablet 3  . KRILL OIL PO Take by mouth.    . loratadine (CLARITIN) 10 MG tablet Take by mouth.    . losartan (COZAAR) 50 MG tablet Take 50 mg by mouth daily.    Marland Kitchen. lovastatin (MEVACOR) 20 MG tablet TAKE 1 TABLET AT BEDTIME 90 tablet 1  . montelukast (SINGULAIR) 10 MG tablet Take 1 tablet (10 mg total) at bedtime by mouth. 90 tablet 3  . Multiple Vitamins-Minerals (CENTRUM SILVER PO) Take by mouth daily.    . potassium chloride SA (K-DUR,KLOR-CON) 20 MEQ tablet Take 1 tablet (20 mEq total) daily by mouth. 90 tablet 3  . Probiotic Product (ALIGN PO) Take by mouth.    . triamcinolone (NASACORT AQ) 55 MCG/ACT AERO nasal inhaler Place 2 sprays into the nose daily. 48 mL 3   No current facility-administered medications on file prior to visit.    Social History   Socioeconomic History  . Marital status: Married    Spouse name: Not on file  . Number of children: 3  . Years of education: 1312  . Highest education level: Not on file  Social Needs  . Financial resource strain: Not on file  . Food insecurity - worry: Not on file  . Food insecurity - inability: Not on file  . Transportation needs - medical: Not on file  . Transportation needs - non-medical: Not on file  Occupational History  . Occupation: retired    Comment: Education officer, [employer] 2003  Tobacco Use  . Smoking status: Never Smoker  . Smokeless tobacco: Never Used  Substance and Sexual Activity  . Alcohol use: Yes    Comment: once weekly  . Drug use: No  . Sexual activity: Yes    Birth control/protection: Post-menopausal  Other Topics Concern  . Not on file  Social History Narrative      Marital status:   Married x 46 years; happily; no abuse.       Children:  3 children and 8 grandchildren.      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        Tobacco; never      Alcohol: rare; once per month      Drugs: none     Caffeine Use: Coffee, tea, 3 servings / day.       Exercise: Moderate, 4 x weeg walking; Recently joined a gym.      Guns: none      Seatbelt: 100%; no texting      ADLs: independent with ADLs.; driving; no assistant devices  Advanced Directives:  None; FULL CODE; no prolonged measures   Family History  Problem Relation Age of Onset  . Hypertension Mother   . Heart disease Mother        CAD/valve replacement/CABG  . Stroke Mother 42       TIA's multiple  . Cancer Mother        bladder  . Glaucoma Mother   . Aortic aneurysm Mother   . Emphysema Father   . Glaucoma Father        also Mother  . COPD Father   . Hyperlipidemia Brother   . Meniere's disease Brother   . Hypertension Sister   . Aortic aneurysm Unknown   . Breast cancer Neg Hx        Objective:    BP 130/82   Pulse 91   Temp 98.6 F (37 C) (Oral)   Resp 16   Ht 5' 1.81" (1.57 m)   Wt 159 lb (72.1 kg)   SpO2 95%   BMI 29.26 kg/m  Physical Exam  Constitutional: She is oriented to person, place, and time. She appears well-developed and well-nourished. No distress.  HENT:  Head: Normocephalic and atraumatic.  Right Ear: Tympanic membrane, external ear and ear canal normal.  Left Ear: Tympanic membrane, external ear and ear canal normal.  Nose: Mucosal edema and rhinorrhea present. Right sinus exhibits maxillary sinus tenderness. Right sinus exhibits no frontal sinus tenderness. Left sinus exhibits maxillary sinus tenderness. Left sinus exhibits no frontal sinus tenderness.  Mouth/Throat: Oropharynx is clear and moist.  Eyes: Conjunctivae and EOM are normal. Pupils are equal, round, and reactive to light.  Neck: Normal range of motion. Neck supple. Carotid bruit is not present. No  thyromegaly present.  Cardiovascular: Normal rate, regular rhythm, normal heart sounds and intact distal pulses. Exam reveals no gallop and no friction rub.  No murmur heard. Pulmonary/Chest: Effort normal and breath sounds normal. She has no wheezes. She has no rales.  Lymphadenopathy:    She has no cervical adenopathy.  Neurological: She is alert and oriented to person, place, and time. No cranial nerve deficit.  Skin: Skin is warm and dry. No rash noted. She is not diaphoretic. No erythema. No pallor.  Psychiatric: She has a normal mood and affect. Her behavior is normal.   No results found. Depression screen [facility] 2/9 06/24/2017 05/20/2017 11/12/2016 05/13/2016 11/06/2015  Decreased Interest 0 0 0 0 0  Down, Depressed, Hopeless 0 0 0 0 0  PHQ - 2 Score 0 0 0 0 0   Fall Risk  06/24/2017 05/20/2017 11/12/2016 05/13/2016 11/06/2015  Falls in the past year? No No No No No  Comment - - - - -        Assessment & Plan:   1. Acute recurrent maxillary sinusitis     Persistent acute sinusitis despite recent antibiotic therapy.  Refill of clindamycin and Robitussin with codeine therapy for persistent symptoms.  No orders of the defined types were placed in this encounter.  Meds ordered this encounter  Medications  . clindamycin (CLEOCIN) 300 MG capsule    Sig: Take 1 capsule (300 mg total) by mouth 3 (three) times daily.    Dispense:  30 capsule    Refill:  0  . guaiFENesin-codeine 100-10 MG/5ML syrup    Sig: Take 5 mLs by mouth every 4 (four) hours as needed for cough.    Dispense:  120 mL    Refill:  0    No Follow-up  on file.   Raylene Carmickle Elayne Guerin, M.D. [facility] at [facility] [address] [place], [place]  [address] [phone] phone [phone] fax

## 2017-09-04 ENCOUNTER — Other Ambulatory Visit: Payer: Self-pay | Admitting: Family Medicine

## 2017-09-04 DIAGNOSIS — Z1231 Encounter for screening mammogram for malignant neoplasm of breast: Secondary | ICD-10-CM

## 2017-09-16 ENCOUNTER — Ambulatory Visit
Admission: RE | Admit: 2017-09-16 | Discharge: 2017-09-16 | Disposition: A | Discharge status: Home / Self Care | Payer: BLUE CROSS/BLUE SHIELD | Source: Ambulatory Visit | Attending: Family Medicine | Admitting: Family Medicine

## 2017-09-16 DIAGNOSIS — Z1231 Encounter for screening mammogram for malignant neoplasm of breast: Secondary | ICD-10-CM | POA: Insufficient documentation

## 2017-09-22 ENCOUNTER — Encounter
Admission: RE | Disposition: A | Discharge status: Home / Self Care | Payer: Self-pay | Source: Ambulatory Visit | Attending: Gastroenterology

## 2017-09-22 ENCOUNTER — Ambulatory Visit
Admission: RE | Admit: 2017-09-22 | Discharge: 2017-09-22 | Disposition: A | Discharge status: Home / Self Care | Payer: BLUE CROSS/BLUE SHIELD | Source: Ambulatory Visit | Attending: Gastroenterology | Admitting: Gastroenterology

## 2017-09-22 ENCOUNTER — Ambulatory Visit: Payer: BLUE CROSS/BLUE SHIELD | Admitting: Anesthesiology

## 2017-09-22 ENCOUNTER — Encounter: Payer: Self-pay | Admitting: *Deleted

## 2017-09-22 DIAGNOSIS — Z7982 Long term (current) use of aspirin: Secondary | ICD-10-CM | POA: Insufficient documentation

## 2017-09-22 DIAGNOSIS — Z888 Allergy status to other drugs, medicaments and biological substances status: Secondary | ICD-10-CM | POA: Diagnosis not present

## 2017-09-22 DIAGNOSIS — Z882 Allergy status to sulfonamides status: Secondary | ICD-10-CM | POA: Insufficient documentation

## 2017-09-22 DIAGNOSIS — K644 Residual hemorrhoidal skin tags: Secondary | ICD-10-CM | POA: Diagnosis not present

## 2017-09-22 DIAGNOSIS — Z881 Allergy status to other antibiotic agents status: Secondary | ICD-10-CM | POA: Diagnosis not present

## 2017-09-22 DIAGNOSIS — Z880 Allergy status to penicillin: Secondary | ICD-10-CM | POA: Diagnosis not present

## 2017-09-22 DIAGNOSIS — K219 Gastro-esophageal reflux disease without esophagitis: Secondary | ICD-10-CM | POA: Diagnosis not present

## 2017-09-22 DIAGNOSIS — K5730 Diverticulosis of large intestine without perforation or abscess without bleeding: Secondary | ICD-10-CM | POA: Diagnosis not present

## 2017-09-22 DIAGNOSIS — I10 Essential (primary) hypertension: Secondary | ICD-10-CM | POA: Diagnosis not present

## 2017-09-22 DIAGNOSIS — Z8371 Family history of colonic polyps: Secondary | ICD-10-CM | POA: Insufficient documentation

## 2017-09-22 DIAGNOSIS — Z79899 Other long term (current) drug therapy: Secondary | ICD-10-CM | POA: Diagnosis not present

## 2017-09-22 HISTORY — DX: Endocarditis, valve unspecified: I38

## 2017-09-22 HISTORY — DX: Headache: R51

## 2017-09-22 HISTORY — DX: Headache, unspecified: R51.9

## 2017-09-22 HISTORY — DX: Nonrheumatic aortic (valve) stenosis: I35.0

## 2017-09-22 HISTORY — PX: COLONOSCOPY WITH PROPOFOL: SHX5780

## 2017-09-22 SURGERY — COLONOSCOPY WITH PROPOFOL
Anesthesia: General

## 2017-09-22 MED ORDER — PROPOFOL 500 MG/50ML IV EMUL
INTRAVENOUS | Status: DC | PRN
  Administered 2017-09-22: 140 ug/kg/min via INTRAVENOUS

## 2017-09-22 MED ORDER — PROPOFOL 10 MG/ML IV BOLUS
INTRAVENOUS | Status: DC | PRN
  Administered 2017-09-22: 100 mg via INTRAVENOUS

## 2017-09-22 MED ORDER — LIDOCAINE HCL (PF) 1 % IJ SOLN
INTRAMUSCULAR | Status: AC
Start: 2017-09-22 — End: 2017-09-22
  Administered 2017-09-22: 0.3 mL via INTRADERMAL
  Filled 2017-09-22: qty 2

## 2017-09-22 MED ORDER — PROPOFOL 500 MG/50ML IV EMUL
INTRAVENOUS | Status: AC
  Filled 2017-09-22: qty 50

## 2017-09-22 MED ORDER — FENTANYL CITRATE (PF) 100 MCG/2ML IJ SOLN
INTRAMUSCULAR | Status: DC | PRN
  Administered 2017-09-22 (×2): 50 ug via INTRAVENOUS

## 2017-09-22 MED ORDER — LIDOCAINE 2% (20 MG/ML) 5 ML SYRINGE
INTRAMUSCULAR | Status: DC | PRN
  Administered 2017-09-22: 30 mg via INTRAVENOUS

## 2017-09-22 MED ORDER — SODIUM CHLORIDE 0.9 % IV SOLN
INTRAVENOUS | Status: DC
  Administered 2017-09-22: 12:00:00 via INTRAVENOUS

## 2017-09-22 MED ORDER — SODIUM CHLORIDE 0.9 % IV SOLN
INTRAVENOUS | Status: DC
  Administered 2017-09-22: 1000 mL via INTRAVENOUS

## 2017-09-22 MED ORDER — LIDOCAINE HCL (PF) 1 % IJ SOLN
2.0000 mL | Freq: Once | INTRAMUSCULAR | Status: AC
  Administered 2017-09-22: 0.3 mL via INTRADERMAL

## 2017-09-22 MED ORDER — LIDOCAINE HCL (PF) 2 % IJ SOLN
INTRAMUSCULAR | Status: AC
  Filled 2017-09-22: qty 10

## 2017-09-22 MED ORDER — FENTANYL CITRATE (PF) 100 MCG/2ML IJ SOLN
INTRAMUSCULAR | Status: AC
  Filled 2017-09-22: qty 2

## 2017-09-22 NOTE — Anesthesia Post-op Follow-up Note (Signed)
Anesthesia QCDR form completed.        

## 2017-09-22 NOTE — Op Note (Addendum)
[facility] Patient Name: Heather Ortiz Procedure Date: 09/22/2017 12:03 PM MRN: [id number] Account #: [id number] Date of Birth: [birth year] Admit Type: Outpatient Age: 72 Room: [facility] ENDO ROOM 3 Gender: Female Note Status: Finalized Procedure:            Colonoscopy Indications:          Family history of colonic polyps in a first-degree                        relative Providers:            Christena Deem, MD Referring MD:         Myrle Sheng. Katrinka Blazing, MD (Referring MD) Medicines:            Monitored Anesthesia Care Complications:        No immediate complications. Procedure:            Pre-Anesthesia Assessment:                       - ASA Grade Assessment: III - A patient with severe                        systemic disease.                       After obtaining informed consent, the colonoscope was                        passed under direct vision. Throughout the procedure,                        the patient's blood pressure, pulse, and oxygen                        saturations were monitored continuously. The                        Colonoscope was introduced through the and advanced to                        thesigmoid. The colonoscopy was unusually difficult due                        to restricted mobility of the colon. The patient                        tolerated the procedure well. The quality of the bowel                        preparation was good. Findings:      A single small-mouthed diverticulum was found in the sigmoid colon and       distal sigmoid colon.      The scope was advanced up a tortuous pathway , multiple turns to 25 cm       marking on the scope. At this point there is a sharp angulation that I       was unable to pass despite multiple position changes, abdominal support       and change of scope.      The digital rectal exam findings include non-thrombosed external       hemorrhoids  with evidence of a previous old  posterior midline anal       fissure, normal otherwise. Impression:           - Diverticulosis in the sigmoid colon and in the distal                        sigmoid colon.                       - Non-thrombosed external hemorrhoids found on digital                        rectal exam.                       - No specimens collected. Recommendation:       - Discharge patient to home.                       - Perform a virtual colonoscopy at appointment to be                        scheduled. Procedure Code(s):    --- Professional ---                       [id number], 53, Colonoscopy, flexible; diagnostic, including                        collection of specimen(s) by brushing or washing, when                        performed (separate procedure) Diagnosis Code(s):    --- Professional ---                       K64.4, Residual hemorrhoidal skin tags                       Z83.71, Family history of colonic polyps                       K57.30, Diverticulosis of large intestine without                        perforation or abscess without bleeding CPT copyright 2016 American Medical Association. All rights reserved. The codes documented in this report are preliminary and upon coder review may  be revised to meet current compliance requirements. Christena DeemMartin U Mubarak Bevens, MD 09/22/2017 1:17:24 PM This report has been signed electronically. Number of Addenda: 0 Note Initiated On: 09/22/2017 12:03 PM Total Procedure Duration: 0 hours 39 minutes 3 seconds       [facility]

## 2017-09-22 NOTE — H&P (Signed)
Outpatient short stay form Pre-procedure 09/22/2017 12:15 PM Heather DeemMartin U Gitel Beste MD  Primary Physician: Oswaldo DoneKristy Smith MD  Reason for visit:  colonoscopy  History of present illness:  Patient is a [age] year old female presenting today for a colonoscopy.  There is family history of colon polyps and multiple primary relatives.  She tolerated prep well.  She takes no blood thinning agents with the exception of 81 mg aspirin.  She takes no other aspirin products.    Current Facility-Administered Medications:  .  0.9 %  sodium chloride infusion, , Intravenous, Continuous, Heather DeemSkulskie, Ski Polich U, MD .  0.9 %  sodium chloride infusion, , Intravenous, Continuous, Heather DeemSkulskie, Heidee Audi U, MD .  lidocaine (PF) (XYLOCAINE) 1 % injection 2 mL, 2 mL, Intradermal, Once, Heather DeemSkulskie, Dannie Hattabaugh U, MD .  lidocaine (PF) (XYLOCAINE) 1 % injection, , , ,   Medications Prior to Admission  Medication Sig Dispense Refill Last Dose  . aspirin 81 MG tablet Take 81 mg by mouth daily.   09/21/2017  . azelastine (ASTELIN) 0.1 % nasal spray Place 2 sprays 2 (two) times daily into both nostrils. Use in each nostril as directed 90 mL 3 Taking  . Calcium Carbonate-Vitamin D 600-400 MG-UNIT tablet Take by mouth.   Taking  . Cholecalciferol (VITAMIN D3) 1000 units CAPS Take by mouth.   Taking  . clindamycin (CLEOCIN) 300 MG capsule Take 1 capsule (300 mg total) by mouth 3 (three) times daily. 30 capsule 0   . co-enzyme Q-10 30 MG capsule Take 200 mg by mouth 3 (three) times daily.   Taking  . esomeprazole (NEXIUM) 40 MG capsule TAKE 1 CAPSULE DAILY BEFORE BREAKFAST 90 capsule 3 Taking  . estradiol (ESTRACE VAGINAL) 0.1 MG/GM vaginal cream Place 1 Applicatorful 3 (three) times a week vaginally. 127.5 g 3 Taking  . furosemide (LASIX) 20 MG tablet Take 1 tablet (20 mg total) daily by mouth. 90 tablet 3 Taking  . guaiFENesin-codeine 100-10 MG/5ML syrup Take 5 mLs by mouth every 4 (four) hours as needed for cough. 120 mL 0   . KRILL OIL PO Take by  mouth.   Taking  . loratadine (CLARITIN) 10 MG tablet Take by mouth.   Taking  . losartan (COZAAR) 50 MG tablet Take 50 mg by mouth daily.   Taking  . lovastatin (MEVACOR) 20 MG tablet TAKE 1 TABLET AT BEDTIME 90 tablet 1 Taking  . montelukast (SINGULAIR) 10 MG tablet Take 1 tablet (10 mg total) at bedtime by mouth. 90 tablet 3 Taking  . Multiple Vitamins-Minerals (CENTRUM SILVER PO) Take by mouth daily.   Taking  . potassium chloride SA (K-DUR,KLOR-CON) 20 MEQ tablet Take 1 tablet (20 mEq total) daily by mouth. 90 tablet 3 Taking  . Probiotic Product (ALIGN PO) Take by mouth.   Taking  . triamcinolone (NASACORT AQ) 55 MCG/ACT AERO nasal inhaler Place 2 sprays into the nose daily. 48 mL 3 Taking     Allergies  Allergen Reactions  . Prednisone Palpitations  . Bee Venom     Bee Stings  . Crestor [Rosuvastatin]     Joint pain  . Influenza Vaccines     Arm Swelling  . Levaquin [Levofloxacin In D5w]     Joint pain , jittery  . Lipitor [Atorvastatin]     Joint pain  . Mobic [Meloxicam]     Flush face   . Penicillins Hives  . Pravastatin     Joint pain  . Simvastatin     Joint pain  .  Sulfa Drugs Cross Reactors Hives  . Zithromax [Azithromycin] Hives  . Ceftin [Cefuroxime Axetil] Rash  . Doxycycline Rash     Past Medical History:  Diagnosis Date  . Allergic rhinitis, cause unspecified   . Allergy   . Diverticulosis of colon (without mention of hemorrhage)   . Esophagitis, unspecified   . External hemorrhoids without mention of complication   . GERD (gastroesophageal reflux disease)   . Headache    migraine  . Mild aortic valve stenosis   . Osteopenia   . Pure hypercholesterolemia   . Symptomatic menopausal or female climacteric states   . Unspecified essential hypertension   . Unspecified gastritis and gastroduodenitis without mention of hemorrhage   . VHD (valvular heart disease)     Review of systems:      Physical Exam    Heart and lungs: Regular rate and  rhythm without rub or gallop, lungs are bilaterally clear    HEENT: Normocephalic atraumatic eyes are anicteric    Other:    Pertinant exam for procedure: Soft nontender nondistended bowel sounds positive normoactive    Planned proceedures: Colonoscopy and indicated procedures. I have discussed the risks benefits and complications of procedures to include not limited to bleeding, infection, perforation and the risk of sedation and the patient wishes to proceed.    Heather Deem, MD Gastroenterology 09/22/2017  12:15 PM

## 2017-09-22 NOTE — Transfer of Care (Signed)
Immediate Anesthesia Transfer of Care Note  Patient: Heather Ortiz  Procedure(s) Performed: COLONOSCOPY WITH PROPOFOL (N/A )  Patient Location: PACU and Endoscopy Unit  Anesthesia Type:General  Level of Consciousness: awake and drowsy  Airway & Oxygen Therapy: Patient Spontanous Breathing and Patient connected to nasal cannula oxygen  Post-op Assessment: Report given to RN and Post -op Vital signs reviewed and stable  Post vital signs: Reviewed and stable  Last Vitals:  Vitals:   09/22/17 1159  BP: 140/68  Pulse: 76  Resp: 17  Temp: (!) 36.3 C  SpO2: 100%    Last Pain:  Vitals:   09/22/17 1159  TempSrc: Tympanic         Complications: No apparent anesthesia complications

## 2017-09-22 NOTE — Anesthesia Preprocedure Evaluation (Addendum)
Anesthesia Evaluation  Patient identified by MRN, date of birth, ID band Patient awake    Reviewed: Allergy & Precautions, NPO status , Patient's Chart, lab work & pertinent test results, reviewed documented beta blocker date and time   Airway Mallampati: III  TM Distance: >3 FB     Dental  (+) Chipped, Missing   Pulmonary           Cardiovascular hypertension, Pt. on medications      Neuro/Psych  Headaches,    GI/Hepatic GERD  ,  Endo/Other    Renal/GU      Musculoskeletal   Abdominal   Peds  Hematology   Anesthesia Other Findings   Reproductive/Obstetrics                            Anesthesia Physical Anesthesia Plan  ASA: III  Anesthesia Plan: General   Post-op Pain Management:    Induction: Intravenous  PONV Risk Score and Plan:   Airway Management Planned:   Additional Equipment:   Intra-op Plan:   Post-operative Plan:   Informed Consent: I have reviewed the patients History and Physical, chart, labs and discussed the procedure including the risks, benefits and alternatives for the proposed anesthesia with the patient or authorized representative who has indicated his/her understanding and acceptance.     Plan Discussed with: CRNA  Anesthesia Plan Comments:         Anesthesia Quick Evaluation

## 2017-09-22 NOTE — Anesthesia Postprocedure Evaluation (Addendum)
Anesthesia Post Note  Patient: Heather Ortiz  Procedure(s) Performed: COLONOSCOPY WITH PROPOFOL (N/A )  Patient location during evaluation: Endoscopy Anesthesia Type: General Level of consciousness: awake and alert Pain management: pain level controlled Vital Signs Assessment: post-procedure vital signs reviewed and stable Respiratory status: spontaneous breathing, nonlabored ventilation, respiratory function stable and patient connected to nasal cannula oxygen Cardiovascular status: blood pressure returned to baseline and stable Postop Assessment: no apparent nausea or vomiting Anesthetic complications: no     Last Vitals:  Vitals:   09/22/17 1325 09/22/17 1335  BP: 112/64 129/74  Pulse: 69 62  Resp: 17 19  Temp:    SpO2: 100% 100%    Last Pain:  Vitals:   09/22/17 1315  TempSrc: Tympanic                 Cleda Mccreedy Piscitello

## 2017-09-23 ENCOUNTER — Encounter: Payer: Self-pay | Admitting: Gastroenterology

## 2017-09-28 ENCOUNTER — Other Ambulatory Visit: Payer: Self-pay | Admitting: Gastroenterology

## 2017-09-28 DIAGNOSIS — K5730 Diverticulosis of large intestine without perforation or abscess without bleeding: Secondary | ICD-10-CM

## 2017-09-28 DIAGNOSIS — Q438 Other specified congenital malformations of intestine: Secondary | ICD-10-CM

## 2017-10-13 ENCOUNTER — Inpatient Hospital Stay
Admission: RE | Admit: 2017-10-13 | Discharge: 2017-10-13 | Disposition: A | Discharge status: Home / Self Care | Payer: BLUE CROSS/BLUE SHIELD | Source: Ambulatory Visit | Attending: Gastroenterology | Admitting: Gastroenterology

## 2017-10-15 ENCOUNTER — Ambulatory Visit
Admission: RE | Admit: 2017-10-15 | Discharge: 2017-10-15 | Disposition: A | Discharge status: Home / Self Care | Payer: BLUE CROSS/BLUE SHIELD | Source: Ambulatory Visit | Attending: Gastroenterology | Admitting: Gastroenterology

## 2017-10-15 DIAGNOSIS — K5730 Diverticulosis of large intestine without perforation or abscess without bleeding: Secondary | ICD-10-CM

## 2017-10-15 DIAGNOSIS — Q438 Other specified congenital malformations of intestine: Secondary | ICD-10-CM

## 2017-10-31 ENCOUNTER — Other Ambulatory Visit: Payer: Self-pay | Admitting: Family Medicine

## 2017-11-18 ENCOUNTER — Ambulatory Visit (INDEPENDENT_AMBULATORY_CARE_PROVIDER_SITE_OTHER): Payer: BLUE CROSS/BLUE SHIELD | Admitting: Family Medicine

## 2017-11-18 ENCOUNTER — Other Ambulatory Visit: Payer: Self-pay | Admitting: Family Medicine

## 2017-11-18 ENCOUNTER — Encounter: Payer: Self-pay | Admitting: Family Medicine

## 2017-11-18 ENCOUNTER — Other Ambulatory Visit: Payer: Self-pay

## 2017-11-18 ENCOUNTER — Encounter: Payer: BLUE CROSS/BLUE SHIELD | Admitting: Family Medicine

## 2017-11-18 VITALS — BP 128/82 | HR 55 | Temp 98.0°F | Resp 16 | Ht 61.42 in | Wt 163.0 lb

## 2017-11-18 DIAGNOSIS — J301 Allergic rhinitis due to pollen: Secondary | ICD-10-CM

## 2017-11-18 DIAGNOSIS — J302 Other seasonal allergic rhinitis: Secondary | ICD-10-CM

## 2017-11-18 DIAGNOSIS — N952 Postmenopausal atrophic vaginitis: Secondary | ICD-10-CM

## 2017-11-18 DIAGNOSIS — I10 Essential (primary) hypertension: Secondary | ICD-10-CM

## 2017-11-18 DIAGNOSIS — E2839 Other primary ovarian failure: Secondary | ICD-10-CM

## 2017-11-18 DIAGNOSIS — Z0000 Encounter for general adult medical examination without abnormal findings: Secondary | ICD-10-CM

## 2017-11-18 DIAGNOSIS — K219 Gastro-esophageal reflux disease without esophagitis: Secondary | ICD-10-CM

## 2017-11-18 DIAGNOSIS — Z131 Encounter for screening for diabetes mellitus: Secondary | ICD-10-CM

## 2017-11-18 DIAGNOSIS — E7800 Pure hypercholesterolemia, unspecified: Secondary | ICD-10-CM

## 2017-11-18 LAB — POCT URINALYSIS DIP (MANUAL ENTRY)
Bilirubin, UA: NEGATIVE
Blood, UA: NEGATIVE
Glucose, UA: NEGATIVE mg/dL
Ketones, POC UA: NEGATIVE mg/dL
Nitrite, UA: NEGATIVE
Protein Ur, POC: NEGATIVE mg/dL
Spec Grav, UA: 1.015 (ref 1.010–1.025)
Urobilinogen, UA: 0.2 U/dL
pH, UA: 7 (ref 5.0–8.0)

## 2017-11-18 MED ORDER — FUROSEMIDE 20 MG PO TABS: 20.0000 mg | ORAL_TABLET | Freq: Every day | ORAL | 3 refills | Status: AC

## 2017-11-18 MED ORDER — TRIAMCINOLONE ACETONIDE 55 MCG/ACT NA AERO: 2.0000 | INHALATION_SPRAY | Freq: Every day | NASAL | 3 refills | Status: AC

## 2017-11-18 MED ORDER — AZELASTINE HCL 0.1 % NA SOLN: 2.0000 | Freq: Two times a day (BID) | NASAL | 3 refills | Status: AC

## 2017-11-18 MED ORDER — POTASSIUM CHLORIDE CRYS ER 20 MEQ PO TBCR: 20.0000 meq | EXTENDED_RELEASE_TABLET | Freq: Every day | ORAL | 3 refills | Status: AC

## 2017-11-18 MED ORDER — ESTRADIOL 0.1 MG/GM VA CREA: 1.0000 | TOPICAL_CREAM | VAGINAL | 3 refills | Status: AC

## 2017-11-18 MED ORDER — MONTELUKAST SODIUM 10 MG PO TABS: 10.0000 mg | ORAL_TABLET | Freq: Every day | ORAL | 3 refills | Status: DC

## 2017-11-18 MED ORDER — ESOMEPRAZOLE MAGNESIUM 40 MG PO CPDR: DELAYED_RELEASE_CAPSULE | ORAL | 3 refills | Status: AC

## 2017-11-18 MED ORDER — ZOSTER VAC RECOMB ADJUVANTED 50 MCG/0.5ML IM SUSR: 0.5000 mL | Freq: Once | INTRAMUSCULAR | 1 refills | Status: AC

## 2017-11-18 MED ORDER — LOVASTATIN 20 MG PO TABS: 20.0000 mg | ORAL_TABLET | Freq: Every day | ORAL | 3 refills | Status: AC

## 2017-11-18 NOTE — Patient Instructions (Addendum)
Preventive Care 65 Years and Older, Female Preventive care refers to lifestyle choices and visits with your health care provider that can promote health and wellness. What does preventive care include?  A yearly physical exam. This is also called an annual well check.  Dental exams once or twice a year.  Routine eye exams. Ask your health care provider how often you should have your eyes checked.  Personal lifestyle choices, including: ? Daily care of your teeth and gums. ? Regular physical activity. ? Eating a healthy diet. ? Avoiding tobacco and drug use. ? Limiting alcohol use. ? Practicing safe sex. ? Taking low-dose aspirin every day. ? Taking vitamin and mineral supplements as recommended by your health care provider. What happens during an annual well check? The services and screenings done by your health care provider during your annual well check will depend on your age, overall health, lifestyle risk factors, and family history of disease. Counseling Your health care provider may ask you questions about your:  Alcohol use.  Tobacco use.  Drug use.  Emotional well-being.  Home and relationship well-being.  Sexual activity.  Eating habits.  History of falls.  Memory and ability to understand (cognition).  Work and work environment.  Reproductive health.  Screening You may have the following tests or measurements:  Height, weight, and BMI.  Blood pressure.  Lipid and cholesterol levels. These may be checked every 5 years, or more frequently if you are over 50 years old.  Skin check.  Lung cancer screening. You may have this screening every year starting at age 55 if you have a 30-pack-year history of smoking and currently smoke or have quit within the past 15 years.  Fecal occult blood test (FOBT) of the stool. You may have this test every year starting at age 50.  Flexible sigmoidoscopy or colonoscopy. You may have a sigmoidoscopy every 5 years or  a colonoscopy every 10 years starting at age 50.  Hepatitis C blood test.  Hepatitis B blood test.  Sexually transmitted disease (STD) testing.  Diabetes screening. This is done by checking your blood sugar (glucose) after you have not eaten for a while (fasting). You may have this done every 1-3 years.  Bone density scan. This is done to screen for osteoporosis. You may have this done starting at age 65.  Mammogram. This may be done every 1-2 years. Talk to your health care provider about how often you should have regular mammograms.  Talk with your health care provider about your test results, treatment options, and if necessary, the need for more tests. Vaccines Your health care provider may recommend certain vaccines, such as:  Influenza vaccine. This is recommended every year.  Tetanus, diphtheria, and acellular pertussis (Tdap, Td) vaccine. You may need a Td booster every 10 years.  Varicella vaccine. You may need this if you have not been vaccinated.  Zoster vaccine. You may need this after age 60.  Measles, mumps, and rubella (MMR) vaccine. You may need at least one dose of MMR if you were born in 1957 or later. You may also need a second dose.  Pneumococcal 13-valent conjugate (PCV13) vaccine. One dose is recommended after age 65.  Pneumococcal polysaccharide (PPSV23) vaccine. One dose is recommended after age 65.  Meningococcal vaccine. You may need this if you have certain conditions.  Hepatitis A vaccine. You may need this if you have certain conditions or if you travel or work in places where you may be exposed to hepatitis   to hepatitis A.  Hepatitis B vaccine. You may need this if you have certain conditions or if you travel or work in places where you may be exposed to hepatitis B.  Haemophilus influenzae type b (Hib) vaccine. You may need this if you have certain conditions.  Talk to your health care provider about which screenings and vaccines you need and how  often you need them. This information is not intended to replace advice given to you by your health care provider. Make sure you discuss any questions you have with your health care provider. Document Released: 07/20/2015 Document Revised: 03/12/2016 Document Reviewed: 04/24/2015 Elsevier Interactive Patient Education  2018 [employer]   IF you received an x-ray today, you will receive an invoice from [facility]. Please contact [facility] at [phone] with questions or concerns regarding your invoice.   IF you received labwork today, you will receive an invoice from [facility]. Please contact [facility] at [phone] with questions or concerns regarding your invoice.   Our billing staff will not be able to assist you with questions regarding bills from these companies.  You will be contacted with the lab results as soon as they are available. The fastest way to get your results is to activate your My Chart account. Instructions are located on the last page of this paperwork. If you have not heard from us regarding the results in 2 weeks, please contact this office.      

## 2017-11-18 NOTE — Telephone Encounter (Signed)
Copied from CRM [id number]. Topic: Quick Communication - See Telephone Encounter >> Nov 18, 2017  1:48 PM Rudi Coco, NT wrote: CRM for notification. See Telephone encounter for: 11/18/17.  [facility] calling to speak with nurse or Provider about med. triamcinolone (NASACORT AQ) 55 MCG/ACT AERO nasal inhaler [[id number]] has been discounted and something else is needing to be called in  ref # [id number]  [facility] - [place], [place] - [address] [place] [address] Phone: [phone] Fax: [phone]

## 2017-11-18 NOTE — Progress Notes (Signed)
Subjective:    Patient ID: Heather Ortiz, female    DOB: 12-18-1945, 72 y.o.   MRN: [id number]  11/18/2017  Annual Exam    HPI This 72 y.o. female presents for COMPLETE PHYSICAL EXAMINATION.  Last physical:  11-12-16 Pap smear:  N/a due to age. Mammogram:  09-16-2017 Colonoscopy: 09-2017 Bone density:  19-1478    Visual Acuity Screening   Right eye Left eye Both eyes  Without correction:     With correction:    BP Readings from Last 3 Encounters:  11/18/17 128/82  09/22/17 129/74  06/24/17 130/82   Wt Readings from Last 3 Encounters:  11/18/17 163 lb (73.9 kg)  09/22/17 159 lb (72.1 kg)  06/24/17 159 lb (72.1 kg)   Immunization History  Administered Date(s) Administered  . Pneumococcal Conjugate-13 01/04/2014  . Pneumococcal Polysaccharide-23 12/20/2012  . Tdap 08/14/2009   Health Maintenance  Topic Date Due  . INFLUENZA VACCINE  02/04/2018  . MAMMOGRAM  09/17/2019  . TETANUS/TDAP  07/07/2020  . COLONOSCOPY  09/23/2027  . DEXA SCAN  Completed  . Hepatitis C Screening  Completed  . PNA vac Low Risk Adult  Completed    Review of Systems  Constitutional: Negative for activity change, appetite change, chills, diaphoresis, fatigue, fever and unexpected weight change.  HENT: Positive for postnasal drip and rhinorrhea. Negative for congestion, dental problem, drooling, ear discharge, ear pain, facial swelling, hearing loss, mouth sores, nosebleeds, sinus pressure, sneezing, sore throat, tinnitus, trouble swallowing and voice change.   Eyes: Negative for photophobia, pain, discharge, redness, itching and visual disturbance.  Respiratory: Negative for apnea, cough, choking, chest tightness, shortness of breath, wheezing and stridor.   Cardiovascular: Negative for chest pain, palpitations and leg swelling.  Gastrointestinal: Negative for abdominal distention, abdominal pain, anal bleeding, blood in stool, constipation, diarrhea, nausea, rectal pain  and vomiting.  Endocrine: Negative for cold intolerance, heat intolerance, polydipsia, polyphagia and polyuria.  Genitourinary: Negative for decreased urine volume, difficulty urinating, dyspareunia, dysuria, enuresis, flank pain, frequency, genital sores, hematuria, menstrual problem, pelvic pain, urgency, vaginal bleeding, vaginal discharge and vaginal pain.       Nocturia x 0.  Rare urinary leakage.  Musculoskeletal: Positive for neck pain and neck stiffness. Negative for arthralgias, back pain, gait problem, joint swelling and myalgias.  Skin: Negative for color change, pallor, rash and wound.  Allergic/Immunologic: Negative for environmental allergies, food allergies and immunocompromised state.  Neurological: Negative for dizziness, tremors, seizures, syncope, facial asymmetry, speech difficulty, weakness, light-headedness, numbness and headaches.  Hematological: Negative for adenopathy. Does not bruise/bleed easily.  Psychiatric/Behavioral: Negative for agitation, behavioral problems, confusion, decreased concentration, dysphoric mood, hallucinations, self-injury, sleep disturbance and suicidal ideas. The patient is not nervous/anxious and is not hyperactive.        Bedtime 1100; wakes up 700.     Past Medical History:  Diagnosis Date  . Allergic rhinitis, cause unspecified   . Allergy   . Diverticulosis of colon (without mention of hemorrhage)   . Esophagitis, unspecified   . External hemorrhoids without mention of complication   . GERD (gastroesophageal reflux disease)   . Headache    migraine  . Mild aortic valve stenosis   . Osteopenia   . Pure hypercholesterolemia   . Symptomatic menopausal or female climacteric states   . Unspecified essential hypertension   . Unspecified gastritis and gastroduodenitis without mention of hemorrhage   . VHD (valvular heart disease)    Past Surgical History:  Procedure Laterality  Date  . CARDIAC CATHETERIZATION  2006   Normal coronary  arteries. [place]  . CHOLECYSTECTOMY  06/2005  . COLONOSCOPY  06/06/2012   normal. [place].  Repeat 5 years.  . COLONOSCOPY WITH PROPOFOL N/A 09/22/2017   Procedure: COLONOSCOPY WITH PROPOFOL;  Surgeon: Christena Deem, MD;  Location: [facility] ENDOSCOPY;  Service: Endoscopy;  Laterality: N/A;  . TUBAL LIGATION     Allergies  Allergen Reactions  . Prednisone Palpitations  . Bee Venom     Bee Stings  . Crestor [Rosuvastatin]     Joint pain  . Influenza Vaccines Swelling    Arm Swelling Arm Swelling  . Levaquin [Levofloxacin In D5w]     Joint pain , jittery  . Lipitor [Atorvastatin]     Joint pain  . Mobic [Meloxicam]     Flush face   . Penicillins Hives  . Pravastatin     Joint pain  . Simvastatin     Joint pain  . Sulfa Antibiotics Hives  . Sulfa Drugs Cross Reactors Hives  . Zithromax [Azithromycin] Hives  . Ceftin [Cefuroxime Axetil] Rash  . Doxycycline Rash   Current Outpatient Medications on File Prior to Visit  Medication Sig Dispense Refill  . aspirin 81 MG tablet Take 81 mg by mouth daily.    . Calcium Carbonate-Vitamin D 600-400 MG-UNIT tablet Take by mouth.    . Cholecalciferol (VITAMIN D3) 1000 units CAPS Take by mouth.    . co-enzyme Q-10 30 MG capsule Take 200 mg by mouth 3 (three) times daily.    Marland Kitchen GAVILYTE-G 236 g solution TAKE 4,000 MLS BY MOUTH ONCE  0  . KRILL OIL PO Take by mouth.    . loratadine (CLARITIN) 10 MG tablet Take by mouth.    . losartan (COZAAR) 50 MG tablet Take 50 mg by mouth daily.    . Multiple Vitamins-Minerals (CENTRUM SILVER PO) Take by mouth daily.    . Probiotic Product (ALIGN PO) Take by mouth.     No current facility-administered medications on file prior to visit.    Social History   Socioeconomic History  . Marital status: Married    Spouse name: Not on file  . Number of children: 3  . Years of education: 70  . Highest education level: Not on file  Occupational History  . Occupation: retired    Comment: Education officer, community  2003  Social Needs  . Financial resource strain: Not on file  . Food insecurity:    Worry: Not on file    Inability: Not on file  . Transportation needs:    Medical: Not on file    Non-medical: Not on file  Tobacco Use  . Smoking status: Never Smoker  . Smokeless tobacco: Never Used  Substance and Sexual Activity  . Alcohol use: Yes    Comment: once weekly  . Drug use: No  . Sexual activity: Yes    Birth control/protection: Post-menopausal  Lifestyle  . Physical activity:    Days per week: Not on file    Minutes per session: Not on file  . Stress: Not on file  Relationships  . Social connections:    Talks on phone: Not on file    Gets together: Not on file    Attends religious service: Not on file    Active member of club or organization: Not on file    Attends meetings of clubs or organizations: Not on file    Relationship status: Not on file  . Intimate  partner violence:    Fear of current or ex partner: Not on file    Emotionally abused: Not on file    Physically abused: Not on file    Forced sexual activity: Not on file  Other Topics Concern  . Not on file  Social History Narrative      Marital status:  Married x 46 years; happily; no abuse.       Children:  3 children and 8 grandchildren.      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        Tobacco; never      Alcohol: rare; once per month      Drugs: none     Caffeine Use: Coffee, tea, 3 servings / day.       Exercise: Moderate, 4 x weeg walking; Recently joined a gym.      Guns: none      Seatbelt: 100%; no texting      ADLs: independent with ADLs.; driving; no assistant devices      Advanced Directives:  None; FULL CODE; no prolonged measures   Family History  Problem Relation Age of Onset  . Hypertension Mother   . Heart disease Mother        CAD/valve replacement/CABG  . Stroke Mother 53       TIA's multiple  . Cancer Mother        bladder  . Glaucoma Mother   . Aortic aneurysm  Mother   . Emphysema Father   . Glaucoma Father        also Mother  . COPD Father   . Hyperlipidemia Brother   . Meniere's disease Brother   . Hypertension Sister   . Aortic aneurysm Unknown   . Breast cancer Neg Hx        Objective:    BP 128/82   Pulse (!) 55   Temp 98 F (36.7 C) (Oral)   Resp 16   Ht 5' 1.42" (1.56 m)   Wt 163 lb (73.9 kg)   SpO2 96%   BMI 30.38 kg/m  Physical Exam  Constitutional: She is oriented to person, place, and time. She appears well-developed and well-nourished. No distress.  HENT:  Head: Normocephalic and atraumatic.  Right Ear: External ear normal.  Left Ear: External ear normal.  Nose: Nose normal.  Mouth/Throat: Oropharynx is clear and moist.  Eyes: Pupils are equal, round, and reactive to light. Conjunctivae and EOM are normal.  Neck: Normal range of motion and full passive range of motion without pain. Neck supple. No JVD present. Carotid bruit is not present. No thyromegaly present.  Cardiovascular: Normal rate, regular rhythm and normal heart sounds. Exam reveals no gallop and no friction rub.  No murmur heard. Pulmonary/Chest: Effort normal and breath sounds normal. She has no wheezes. She has no rales. Right breast exhibits no inverted nipple, no mass, no nipple discharge, no skin change and no tenderness. Left breast exhibits no inverted nipple, no mass, no nipple discharge, no skin change and no tenderness. No breast swelling, tenderness, discharge or bleeding. Breasts are symmetrical.  Abdominal: Soft. Bowel sounds are normal. She exhibits no distension and no mass. There is no tenderness. There is no rebound and no guarding.  Musculoskeletal:       Right shoulder: Normal.       Left shoulder: Normal.       Cervical back: Normal.  Lymphadenopathy:    She has no cervical adenopathy.  Neurological: She is alert  and oriented to person, place, and time. She has normal reflexes. No cranial nerve deficit. She exhibits normal muscle  tone. Coordination normal.  Skin: Skin is warm and dry. No rash noted. She is not diaphoretic. No erythema. No pallor.  Psychiatric: She has a normal mood and affect. Her behavior is normal. Judgment and thought content normal.  Nursing note and vitals reviewed.  No results found. Depression screen [facility] 2/9 11/18/2017 06/24/2017 05/20/2017 11/12/2016 05/13/2016  Decreased Interest 0 0 0 0 0  Down, Depressed, Hopeless 0 0 0 0 0  PHQ - 2 Score 0 0 0 0 0   Fall Risk  11/18/2017 06/24/2017 05/20/2017 11/12/2016 05/13/2016  Falls in the past year? No No No No No  Comment - - - - -        Assessment & Plan:   1. Routine physical examination   2. Essential hypertension, benign   3. Seasonal allergic rhinitis due to pollen   4. Gastroesophageal reflux disease without esophagitis   5. Pure hypercholesterolemia   6. Screening for diabetes mellitus   7. Estrogen deficiency   8. Atrophic vaginitis   9. Other seasonal allergic rhinitis     -anticipatory guidance provided --- exercise, weight loss, safe driving practices, aspirin  daily. -obtain age appropriate screening labs and labs for chronic disease management. -moderate fall risk; no evidence of depression; no evidence of hearing loss.  Discussed advanced directives and living will; also discussed end of life issues including code status.  Refills for chronic medical conditions provided without adjustments today.  Orders Placed This Encounter  Procedures  . DG Bone Density    Standing Status:   Future    Number of Occurrences:   1    Standing Expiration Date:   01/19/2019    Order Specific Question:   Reason for Exam (SYMPTOM  OR DIAGNOSIS REQUIRED)    Answer:   estrogen deficiency    Order Specific Question:   Preferred imaging location?    Answer:   [facility]  . CBC with Differential/Platelet  . Comprehensive metabolic panel    Order Specific Question:   Has the patient fasted?    Answer:   No  . Hemoglobin A1c  . Lipid  panel    Order Specific Question:   Has the patient fasted?    Answer:   No  . TSH  . POCT urinalysis dipstick   Meds ordered this encounter  Medications  . azelastine (ASTELIN) 0.1 % nasal spray    Sig: Place 2 sprays into both nostrils 2 (two) times daily. Use in each nostril as directed    Dispense:  90 mL    Refill:  3  . esomeprazole (NEXIUM) 40 MG capsule    Sig: TAKE 1 CAPSULE DAILY BEFORE BREAKFAST    Dispense:  90 capsule    Refill:  3  . estradiol (ESTRACE VAGINAL) 0.1 MG/GM vaginal cream    Sig: Place 1 Applicatorful vaginally 3 (three) times a week.    Dispense:  127.5 g    Refill:  3  . furosemide (LASIX) 20 MG tablet    Sig: Take 1 tablet (20 mg total) by mouth daily.    Dispense:  90 tablet    Refill:  3  . lovastatin (MEVACOR) 20 MG tablet    Sig: Take 1-2 tablets (20-40 mg total) by mouth at bedtime.    Dispense:  180 tablet    Refill:  3  . montelukast (SINGULAIR) 10 MG  tablet    Sig: Take 1 tablet (10 mg total) by mouth at bedtime.    Dispense:  90 tablet    Refill:  3  . potassium chloride SA (K-DUR,KLOR-CON) 20 MEQ tablet    Sig: Take 1 tablet (20 mEq total) by mouth daily.    Dispense:  90 tablet    Refill:  3  . triamcinolone (NASACORT AQ) 55 MCG/ACT AERO nasal inhaler    Sig: Place 2 sprays into the nose daily.    Dispense:  48 mL    Refill:  3  . Zoster Vaccine Adjuvanted [facility]) injection    Sig: Inject 0.5 mLs into the muscle once for 1 dose.    Dispense:  0.5 mL    Refill:  1    Return in about 6 months (around 05/21/2018) for follow-up chronic medical conditions.   Amee Boothe Paulita Fujita, M.D. [facility] previously [facility] [address] [place], [place]  [address] [phone] phone [phone] fax

## 2017-11-18 NOTE — Telephone Encounter (Signed)
Please advise on alternative.  

## 2017-11-19 ENCOUNTER — Telehealth: Payer: Self-pay | Admitting: Family Medicine

## 2017-11-19 LAB — LIPID PANEL
Chol/HDL Ratio: 3.6 ratio (ref 0.0–4.4)
Cholesterol, Total: 223 mg/dL — ABNORMAL HIGH (ref 100–199)
HDL: 62 mg/dL (ref >39)
LDL Calculated: 134 mg/dL — ABNORMAL HIGH (ref 0–99)
Triglycerides: 136 mg/dL (ref 0–149)
VLDL CHOLESTEROL CAL: 27 mg/dL (ref 5–40)

## 2017-11-19 LAB — COMPREHENSIVE METABOLIC PANEL
A/G RATIO: 1.7 (ref 1.2–2.2)
ALK PHOS: 70 IU/L (ref 39–117)
ALT: 17 IU/L (ref 0–32)
AST: 21 IU/L (ref 0–40)
Albumin: 4.3 g/dL (ref 3.5–4.8)
BILIRUBIN TOTAL: 0.4 mg/dL (ref 0.0–1.2)
BUN/Creatinine Ratio: 24 (ref 12–28)
BUN: 19 mg/dL (ref 8–27)
CALCIUM: 9.6 mg/dL (ref 8.7–10.3)
CHLORIDE: 105 mmol/L (ref 96–106)
CO2: 25 mmol/L (ref 20–29)
Creatinine, Ser: 0.8 mg/dL (ref 0.57–1.00)
GFR calc Af Amer: 86 mL/min/{1.73_m2} (ref >59)
GFR, EST NON AFRICAN AMERICAN: 74 mL/min/{1.73_m2} (ref >59)
Globulin, Total: 2.5 g/dL (ref 1.5–4.5)
Glucose: 87 mg/dL (ref 65–99)
POTASSIUM: 4.4 mmol/L (ref 3.5–5.2)
SODIUM: 144 mmol/L (ref 134–144)
Total Protein: 6.8 g/dL (ref 6.0–8.5)

## 2017-11-19 LAB — CBC WITH DIFFERENTIAL/PLATELET
BASOS: 1 %
Basophils Absolute: 0 10*3/uL (ref 0.0–0.2)
EOS (ABSOLUTE): 0.2 10*3/uL (ref 0.0–0.4)
Eos: 3 %
Hematocrit: 40.4 % (ref 34.0–46.6)
Hemoglobin: 13.6 g/dL (ref 11.1–15.9)
IMMATURE GRANULOCYTES: 0 %
Immature Grans (Abs): 0 10*3/uL (ref 0.0–0.1)
LYMPHS ABS: 2.6 10*3/uL (ref 0.7–3.1)
Lymphs: 43 %
MCH: 30.6 pg (ref 26.6–33.0)
MCHC: 33.7 g/dL (ref 31.5–35.7)
MCV: 91 fL (ref 79–97)
MONOS ABS: 0.3 10*3/uL (ref 0.1–0.9)
Monocytes: 5 %
NEUTROS PCT: 48 %
Neutrophils Absolute: 2.9 10*3/uL (ref 1.4–7.0)
PLATELETS: 236 10*3/uL (ref 150–379)
RBC: 4.44 x10E6/uL (ref 3.77–5.28)
RDW: 14.2 % (ref 12.3–15.4)
WBC: 6 10*3/uL (ref 3.4–10.8)

## 2017-11-19 LAB — TSH: TSH: 2.26 u[IU]/mL (ref 0.450–4.500)

## 2017-11-19 LAB — HEMOGLOBIN A1C
ESTIMATED AVERAGE GLUCOSE: 108 mg/dL
HEMOGLOBIN A1C: 5.4 % (ref 4.8–5.6)

## 2017-11-19 NOTE — Telephone Encounter (Signed)
Copied from [id number]. Topic: Quick Communication - See Telephone Encounter >> Nov 19, 2017  1:55 PM Windy Kalata, NT wrote: CRM for notification. See Telephone encounter for: 11/19/17.  [facility] is calling and states they received a script for triamcinolone (NASACORT AQ) 55 MCG/ACT AERO nasal inhaler and the prescription has been discontinued by the manufacture. Please advise.   Joe cb# [phone] Fax # [phone] Reference #[id number]

## 2017-11-20 NOTE — Telephone Encounter (Signed)
Please advise 

## 2017-11-24 MED ORDER — MOMETASONE FUROATE 50 MCG/ACT NA SUSP: 2.0000 | Freq: Every day | NASAL | 3 refills | Status: AC

## 2017-11-24 NOTE — Telephone Encounter (Signed)
Caller name: Danford Bad from [facility] Call back number: [phone]  Reference # [id number]    Reason for call:  [facility] states final attempt regarding alternate for triamcinolone (NASACORT AQ) 55 MCG/ACT AERO nasal inhaler, medication is discontinued, please advise

## 2017-11-24 NOTE — Telephone Encounter (Signed)
Sent in Nasonex generic.  Please call to see what [facility] covers for patient?

## 2017-11-24 NOTE — Telephone Encounter (Signed)
Call [facility] back; what nasal steroid does patient's insurance plan cover?

## 2017-11-25 ENCOUNTER — Other Ambulatory Visit: Payer: Self-pay | Admitting: *Deleted

## 2017-11-25 ENCOUNTER — Encounter: Payer: Self-pay | Admitting: Family Medicine

## 2017-11-25 NOTE — Progress Notes (Signed)
Medication new order

## 2017-11-25 NOTE — Telephone Encounter (Signed)
Called pharmacy  flonase generic was pended per approval Dr. Katrinka Blazing.  Pharmacy stated allergy contradiction with prednisone.

## 2017-11-25 NOTE — Telephone Encounter (Signed)
Dr. Maree Erie with pharmacist I pended Flonase it was saying allergy contraindication  with prednisone gives heart palpitations .     This is what her insurance will cover.  Is this ok

## 2017-11-25 NOTE — Telephone Encounter (Signed)
Nasocort and generic have been discontinued. Per [facility]ist with [facility] the only other prescription that is similar is flonase.

## 2017-11-26 NOTE — Telephone Encounter (Signed)
Please call patient to see if she can tolerate Flonase. 

## 2017-11-26 NOTE — Telephone Encounter (Signed)
Please call patient to see if she can tolerate Flonase.

## 2017-12-01 ENCOUNTER — Encounter: Payer: Self-pay | Admitting: Family Medicine

## 2017-12-07 ENCOUNTER — Telehealth: Payer: Self-pay | Admitting: *Deleted

## 2017-12-07 NOTE — Telephone Encounter (Signed)
See previous message can she tolerate flonase?

## 2017-12-07 NOTE — Telephone Encounter (Signed)
Lm to call back

## 2017-12-07 NOTE — Telephone Encounter (Signed)
Spoke with patient she is doing well on nasocort will call if that doesn't work for her.

## 2017-12-17 ENCOUNTER — Ambulatory Visit
Admission: RE | Admit: 2017-12-17 | Discharge: 2017-12-17 | Disposition: A | Discharge status: Home / Self Care | Payer: BLUE CROSS/BLUE SHIELD | Source: Ambulatory Visit | Attending: Family Medicine | Admitting: Family Medicine

## 2017-12-17 DIAGNOSIS — E2839 Other primary ovarian failure: Secondary | ICD-10-CM

## 2017-12-26 ENCOUNTER — Other Ambulatory Visit: Payer: Self-pay | Admitting: Family Medicine

## 2017-12-28 NOTE — Telephone Encounter (Signed)
[facility] called and spoke to [place] who verified the receipt of the furosemide on 11/18/17, she processed it and says the patient will receive in 3-5 business days.

## 2018-03-16 ENCOUNTER — Other Ambulatory Visit: Payer: Self-pay | Admitting: Family Medicine

## 2018-03-16 ENCOUNTER — Other Ambulatory Visit: Payer: Self-pay | Admitting: *Deleted

## 2018-03-16 MED ORDER — MONTELUKAST SODIUM 10 MG PO TABS: 10.0000 mg | ORAL_TABLET | Freq: Every day | ORAL | 2 refills | Status: AC

## 2018-10-05 IMAGING — CT CT VIRTUAL COLONOSCOPY DIAGNOSTIC
3 of 9 series · 15 of 46 positions shown, 17 images · non-contrast
Comparison: None.

CLINICAL DATA: Incomplete optical colonoscopy.

EXAM:
CT VIRTUAL COLONOSCOPY DIAGNOSTIC
TECHNIQUE: The patient was given a standard bowel preparation with Gastrografin
and barium for fluid and stool tagging respectively. The quality of
the bowel preparation is moderate. Automated CO2 insufflation of the
colon was performed prior to image acquisition and colonic
distention is moderate. Image post processing was used to generate a
3D endoluminal fly-through projection of the colon and to
electronically subtract stool/fluid as appropriate.

[Series 3: supine colon 1.50 br40 s3 supine thins · axial · 0.82mm/px · z∈[+1350,+1398]mm · 2 of 477 slices shown]
[im 48/477  soft-tissue]
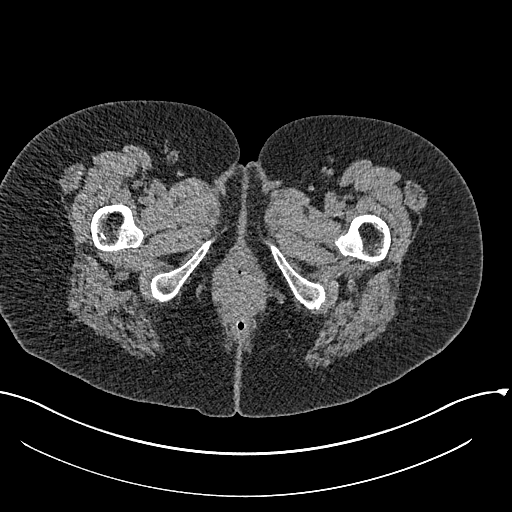
[im 96/477  soft-tissue]
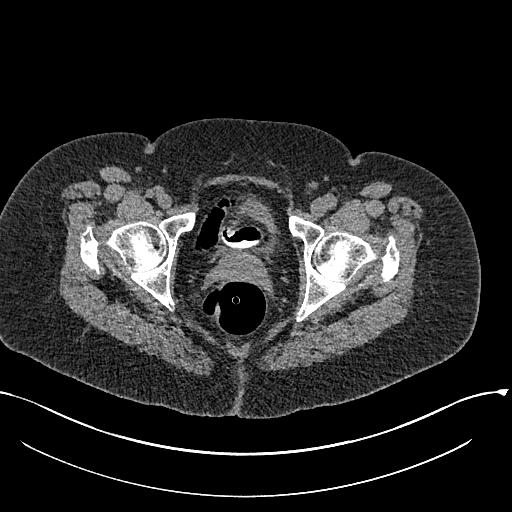

[Series 5: supine colon 3.00 br40 s3 cor cor supine · coronal · 0.72mm/px · 3 of 93 slices shown]
[im 24/93  soft-tissue]
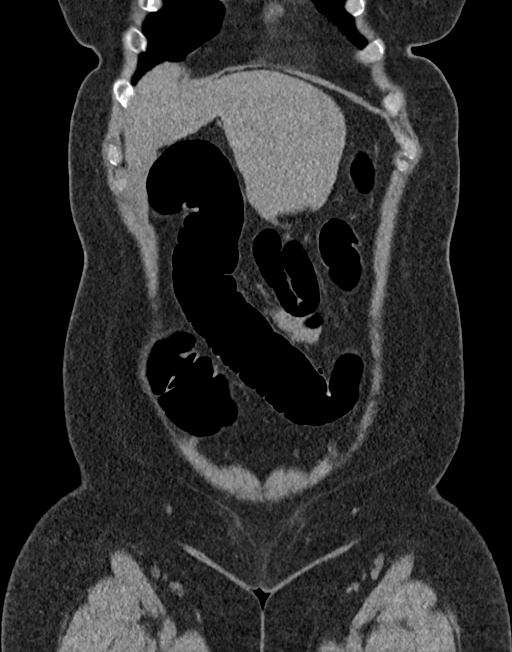
[im 47/93  soft-tissue]
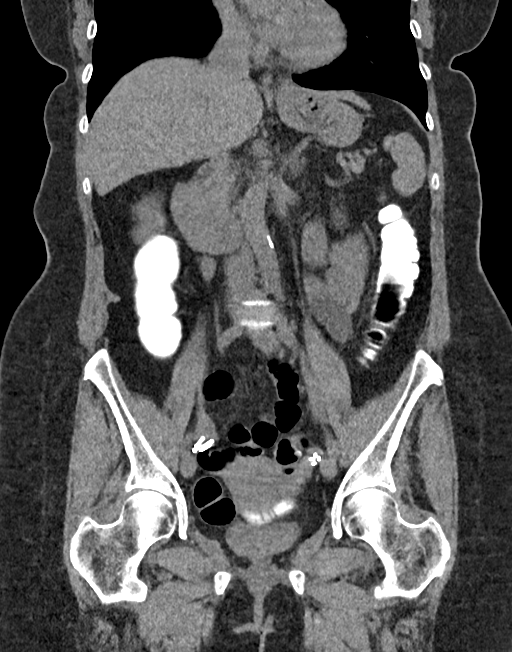
[im 70/93  soft-tissue]
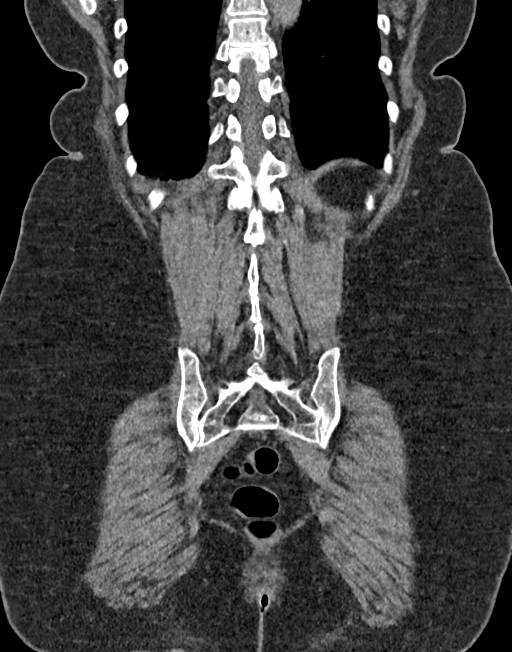

[Series 10: prone colon 1.50 br40 s3 prone thin · axial · 0.74mm/px · z∈[+1272,+1671]mm · 10 of 489 slices shown, 12 images]
[im 45/489  soft-tissue]
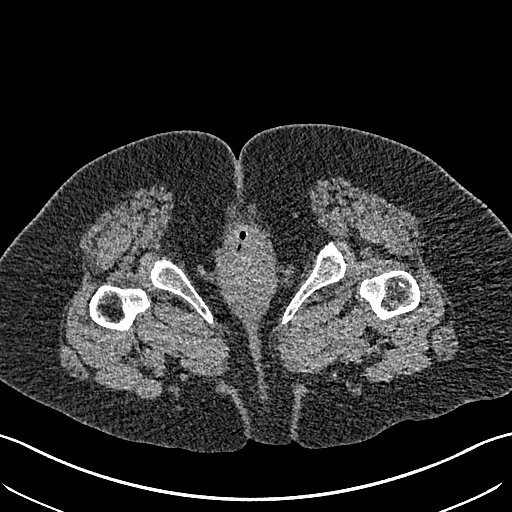
[im 45/489  bone]
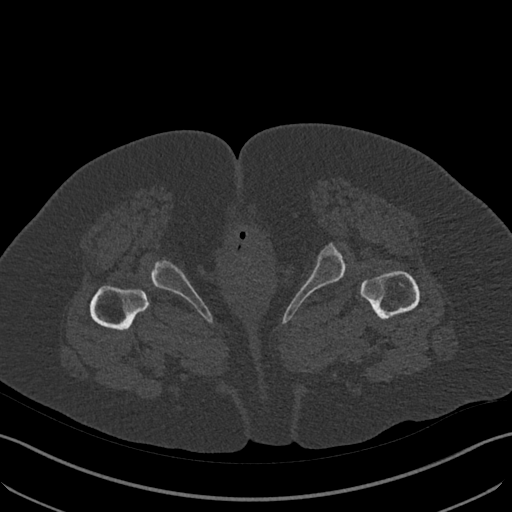
[im 89/489  soft-tissue]
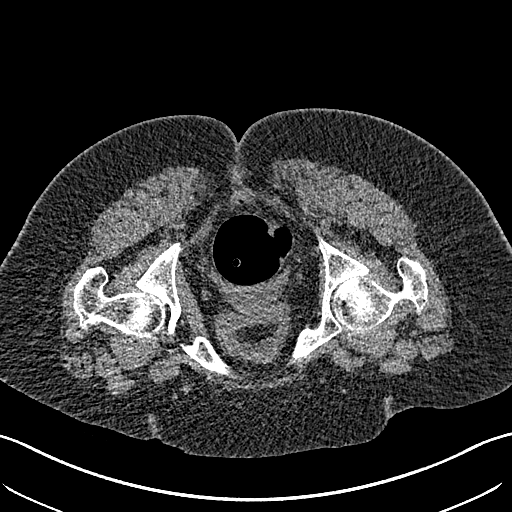
[im 134/489  soft-tissue]
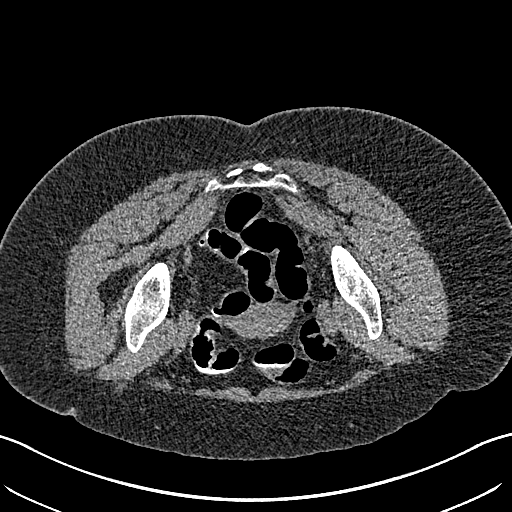
[im 178/489  soft-tissue]
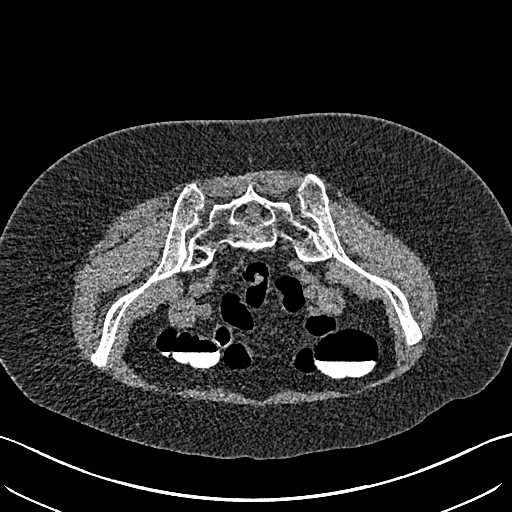
[im 222/489  soft-tissue]
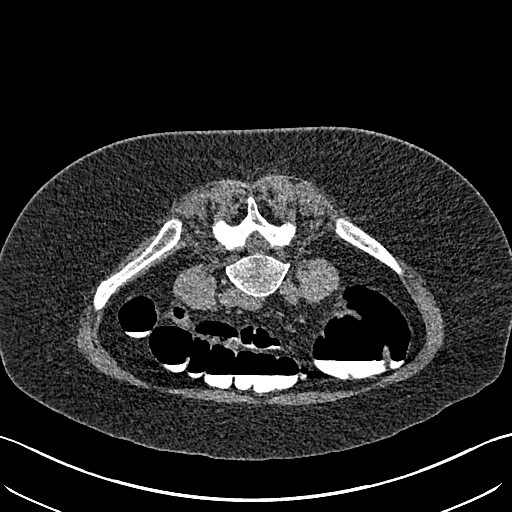
[im 267/489  soft-tissue]
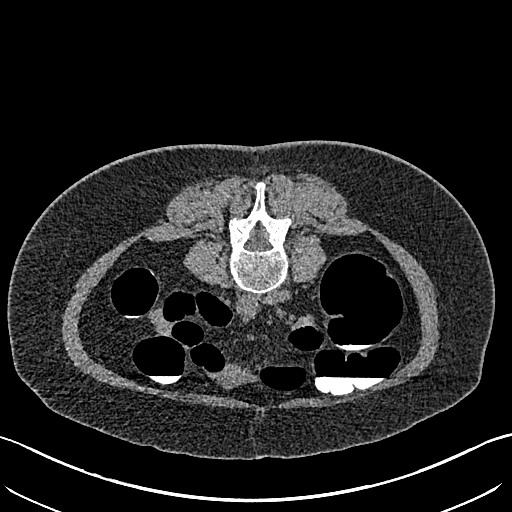
[im 311/489  soft-tissue]
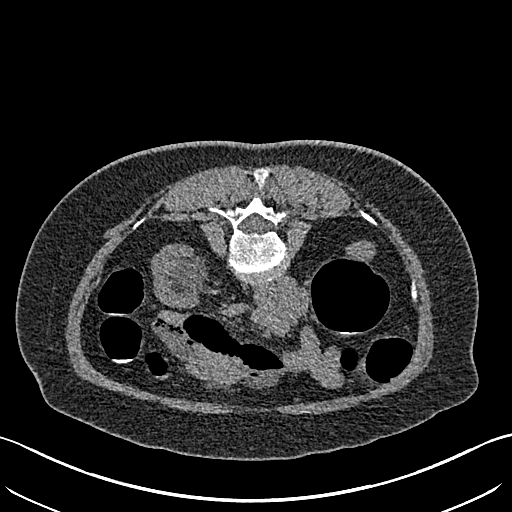
[im 355/489  soft-tissue]
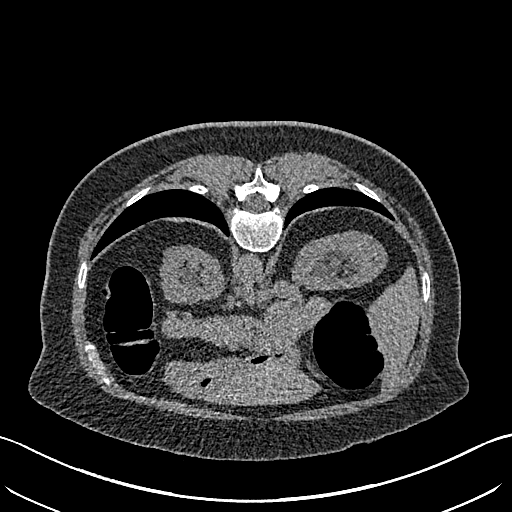
[im 400/489  soft-tissue]
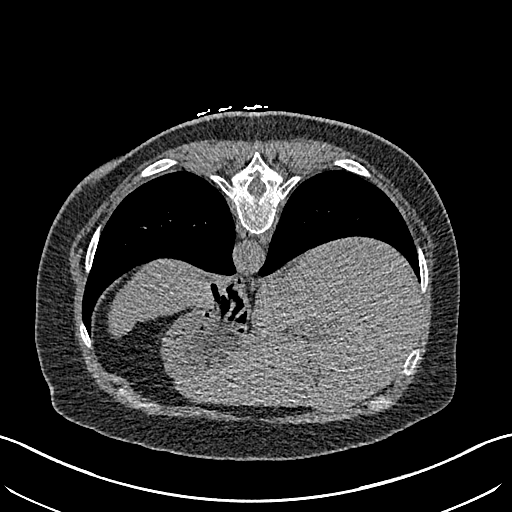
[im 400/489  bone]
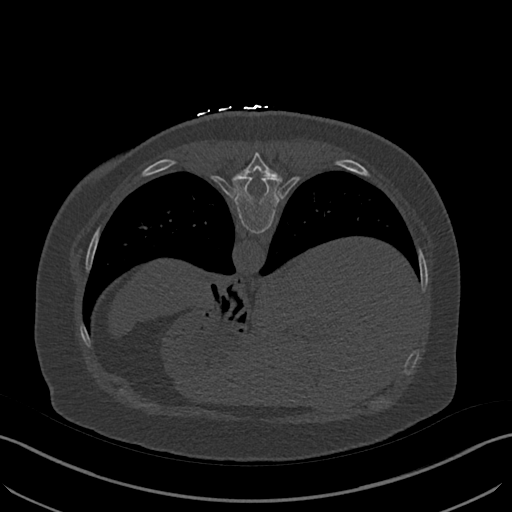
[im 444/489  soft-tissue]
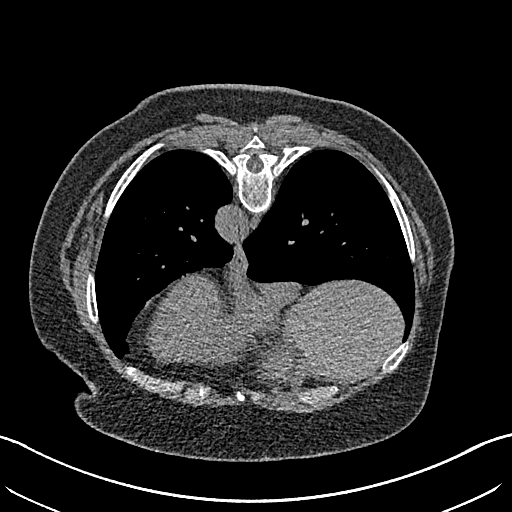

[15 of 46 positions shown; findings below may reference images not displayed]

FINDINGS: VIRTUAL COLONOSCOPY

There is sigmoid diverticulosis. Under distention of the sigmoid
colon, likely related to diverticulosis. There is moderate retained
layering barium throughout the colon. No visible fixed polypoid
filling defects or annular constricting lesions.

Virtual colonoscopy is not designed to detect diminutive polyps
(i.e., less than or equal to 5 mm), the presence or absence of which
may not affect clinical management.

CT ABDOMEN AND PELVIS WITHOUT CONTRAST

Lower chest: Small, flat nodule in the lingula measures 5-6 mm
maximally. Lung bases otherwise clear.

Hepatobiliary: No focal liver abnormality is seen. Status post
cholecystectomy. No biliary dilatation.

Pancreas: No focal abnormality or ductal dilatation.

Spleen: No focal abnormality.  Normal size.

Adrenals/Urinary Tract: Probable bilateral renal parapelvic cysts.
No hydronephrosis. Adrenal glands and urinary bladder unremarkable.

Stomach/Bowel: Stomach and small bowel grossly unremarkable.

Vascular/Lymphatic: Aortic atherosclerosis. No enlarged abdominal or
pelvic lymph nodes.

Reproductive: Uterus and adnexa unremarkable.  No mass.

Other: No free fluid or free air.

Musculoskeletal: No acute bony abnormality.
IMPRESSION: Moderate sigmoid diverticulosis with under distention of the sigmoid
colon, likely related to muscular hypertrophy. No fixed non barium
tagged polypoid filling defects or annular constricting lesions
noted throughout the colon.

recommended. If the nodule is stable at time of repeat CT, then
future CT at 18-24 months (from today's scan) is considered optional
for low-risk patients, but is recommended for high-risk patients.
This recommendation follows the consensus statement: Guidelines for
Management of Incidental Pulmonary Nodules Detected on CT Images:
